# Patient Record
Sex: Male | Born: 1965 | Race: Black or African American | Hispanic: No | Marital: Married | Smoking: Never smoker
Health system: Southern US, Community
[De-identification: ages and names within clinical notes are randomized; demographics above are authoritative.]

## PROBLEM LIST (undated history)

## (undated) DIAGNOSIS — J302 Other seasonal allergic rhinitis: Secondary | ICD-10-CM

## (undated) HISTORY — PX: OTHER SURGICAL HISTORY: SHX169

---

## 2010-05-22 ENCOUNTER — Emergency Department (HOSPITAL_BASED_OUTPATIENT_CLINIC_OR_DEPARTMENT_OTHER): Admission: EM | Admit: 2010-05-22 | Discharge: 2010-05-22 | Payer: Self-pay | Admitting: Emergency Medicine

## 2010-05-22 ENCOUNTER — Ambulatory Visit: Payer: Self-pay | Admitting: Interventional Radiology

## 2011-06-27 ENCOUNTER — Ambulatory Visit (INDEPENDENT_AMBULATORY_CARE_PROVIDER_SITE_OTHER): Payer: Self-pay | Admitting: Family Medicine

## 2011-06-27 ENCOUNTER — Encounter: Payer: Self-pay | Admitting: Family Medicine

## 2011-06-27 VITALS — BP 118/75 | HR 61 | Ht 69.0 in | Wt 191.0 lb

## 2011-06-27 DIAGNOSIS — K625 Hemorrhage of anus and rectum: Secondary | ICD-10-CM

## 2011-06-27 DIAGNOSIS — Z0000 Encounter for general adult medical examination without abnormal findings: Secondary | ICD-10-CM

## 2011-06-27 DIAGNOSIS — Z23 Encounter for immunization: Secondary | ICD-10-CM

## 2011-06-27 LAB — POCT URINALYSIS DIPSTICK
Bilirubin, UA: NEGATIVE
Blood, UA: NEGATIVE
Glucose, UA: NEGATIVE
Ketones, UA: NEGATIVE
Leukocytes, UA: NEGATIVE
Nitrite, UA: NEGATIVE
Protein, UA: NEGATIVE
Spec Grav, UA: 1.02
Urobilinogen, UA: 0.2
pH, UA: 7

## 2011-06-27 NOTE — Patient Instructions (Signed)
Return in 4 months for repeat rectal exam.

## 2011-06-27 NOTE — Progress Notes (Addendum)
  Subjective:    Patient ID: Peter Riley, male    DOB: [birth year], 45 y.o.   MRN: [id number]  HPIPatient is here for HM. Hx of vocal polyops in the [employer].  Family HX of HBP and GF died of colon CA    Review of Systems  Constitutional: Negative.  Negative for activity change.  HENT: Negative.   Respiratory: Negative.   Cardiovascular: Negative.   Gastrointestinal:       Occasion rectal bleeding when straining He noticed blood afew days ago   Genitourinary: Negative.   Musculoskeletal: Negative.   Neurological: Negative.        Objective:   Physical Exam  Constitutional: He is oriented to person, place, and time. He appears well-developed and well-nourished.  HENT:  Head: Normocephalic and atraumatic.  Right Ear: External ear normal.  Left Ear: External ear normal.  Nose: Nose normal.  Mouth/Throat: Oropharynx is clear and moist.  Eyes: Conjunctivae are normal. Pupils are equal, round, and reactive to light.       fundoscopuic normal  Neck: Normal range of motion. Neck supple.  Cardiovascular: Normal rate, regular rhythm, normal heart sounds and intact distal pulses.   Pulmonary/Chest: Effort normal.  Abdominal: Soft.  Genitourinary: Prostate normal and penis normal. Guaiac positive stool.  Musculoskeletal: Normal range of motion.  Neurological: He is alert and oriented to person, place, and time. He has normal reflexes.  Skin: Skin is warm.  Psychiatric: He has a normal mood and affect.          Assessment & Plan:  Patient is to return in 2-3 months for repeat hemoccult and rectal exam.  As per our discussion if hemoccult is still positive we will ned to refer to gastroenterologist for colonoscopy.  As of 07/12/2011 he will be asked to return for reevaluation of kidney function and stop any NSAID and muscle building supplements.

## 2011-06-29 LAB — COMPLETE METABOLIC PANEL WITH GFR
AST: 35 U/L (ref 0–37)
Alkaline Phosphatase: 82 U/L (ref 39–117)
Chloride: 105 mEq/L (ref 96–112)
Creat: 1.44 mg/dL — ABNORMAL HIGH (ref 0.50–1.35)
GFR, Est African American: >60 mL/min (ref >60)
Potassium: 4.4 mEq/L (ref 3.5–5.3)
Sodium: 139 mEq/L (ref 135–145)
Total Protein: 7 g/dL (ref 6.0–8.3)

## 2011-06-29 LAB — LIPID PANEL
HDL: 31 mg/dL — ABNORMAL LOW (ref >39)
Triglycerides: 99 mg/dL (ref <150)
VLDL: 20 mg/dL (ref 0–40)

## 2011-06-29 LAB — CBC WITH DIFFERENTIAL/PLATELET
Basophils Absolute: 0 10*3/uL (ref 0.0–0.1)
Basophils Relative: 0 % (ref 0–1)
HCT: 43.8 % (ref 39.0–52.0)
Hemoglobin: 15.6 g/dL (ref 13.0–17.0)
Lymphocytes Relative: 31 % (ref 12–46)
Lymphs Abs: 1.8 10*3/uL (ref 0.7–4.0)
MCH: 27.1 pg (ref 26.0–34.0)
Monocytes Absolute: 0.5 10*3/uL (ref 0.1–1.0)
Neutro Abs: 3.3 10*3/uL (ref 1.7–7.7)
Neutrophils Relative %: 59 % (ref 43–77)
Platelets: 171 10*3/uL (ref 150–400)
WBC: 5.7 10*3/uL (ref 4.0–10.5)

## 2011-06-29 LAB — PSA: PSA: 0.63 ng/mL (ref <=4.00)

## 2011-07-03 NOTE — Patient Instructions (Addendum)
Patient is to return in 2-3 months for repeat hemoccult and rectal exam.  As per our discussion if hemoccult is still positive we will ned to refer to gastroenterologist for colonoscopy.  As of 07/12/2011 he will be asked to return for reevaluation of kidney function and stop any NSAID and muscle building supplements.

## 2011-08-06 ENCOUNTER — Inpatient Hospital Stay (INDEPENDENT_AMBULATORY_CARE_PROVIDER_SITE_OTHER)
Admission: RE | Admit: 2011-08-06 | Discharge: 2011-08-06 | Disposition: A | Discharge status: Home / Self Care | Payer: Federal, State, Local not specified - PPO | Source: Ambulatory Visit | Attending: Family Medicine | Admitting: Family Medicine

## 2011-08-06 ENCOUNTER — Encounter: Payer: Self-pay | Admitting: Family Medicine

## 2011-08-06 DIAGNOSIS — M6740 Ganglion, unspecified site: Secondary | ICD-10-CM

## 2011-09-16 NOTE — Progress Notes (Signed)
Summary: left wrist has knot on it (rm 3)   Vital Signs:  Patient Profile:   45 Years Old Male CC:      knot on left wrist x 1 month Weight:      188 pounds O2 Sat:      99 % O2 treatment:    Room Air Temp:     98.3 degrees F oral Pulse rate:   56 / minute Resp:     12 per minute BP sitting:   112 / 76  (left arm) Cuff size:   large  Vitals Entered By: Lajean Saver RN (August 06, 2011 11:29 AM)                  Updated Prior Medication List: No Medications Current Allergies: ! CODEINEHistory of Present Illness Chief Complaint: knot on left wrist x 1 month History of Present Illness:  Subjective:  Patient complains of one month history of slowly growing nodule on left wrist.  The nodule has become mildly tender and causes discomfort when using his wrist.  No known trauma.  REVIEW OF SYSTEMS Constitutional Symptoms      Denies fever, chills, night sweats, weight loss, weight gain, and fatigue.  Eyes       Denies change in vision, eye pain, eye discharge, glasses, contact lenses, and eye surgery. Ear/Nose/Throat/Mouth       Denies hearing loss/aids, change in hearing, ear pain, ear discharge, dizziness, frequent runny nose, frequent nose bleeds, sinus problems, sore throat, hoarseness, and tooth pain or bleeding.  Respiratory       Denies dry cough, productive cough, wheezing, shortness of breath, asthma, bronchitis, and emphysema/COPD.  Cardiovascular       Denies murmurs, chest pain, and tires easily with exhertion.    Gastrointestinal       Denies stomach pain, nausea/vomiting, diarrhea, constipation, blood in bowel movements, and indigestion. Genitourniary       Denies painful urination, blood or discharge from penis, kidney stones, and loss of urinary control. Neurological       Denies paralysis, seizures, and fainting/blackouts. Musculoskeletal       Denies muscle pain, joint pain, joint stiffness, decreased range of motion, redness, swelling, muscle weakness,  and gout.  Skin       Denies bruising, unusual mles/lumps or sores, and hair/skin or nail changes.  Psych       Denies mood changes, temper/anger issues, anxiety/stress, speech problems, depression, and sleep problems. Other Comments: KNot on left wrist x 1 month. Painful with movement   Past History:  Past Medical History: Unremarkable  Past Surgical History: vocal polyps removed  Social History: Married Never Smoked Alcohol use-no Drug use-no Smoking Status:  never Drug Use:  no   Objective:  Appearance:  Patient appears healthy, stated age, and in no acute distress  Left wrist:  Full range of motion.  On the volar radial aspect is a mobile subcutaneous nodule about 1cm dia.  There is mild tenderness to palpation with resisted flexion of wrist.  No erythema or warmth.  Distal neurovascular intact  Assessment New Problems: GANGLION CYST, WRIST, LEFT (ICD-727.41)   Plan New Orders: New Patient Level III [16109] Planning Comments:   Will refer to hand surgeon Dr. Melvyn Novas to consider ganglionectomy. [facility] information and instruction patient handout given   The patient and/or caregiver has been counseled thoroughly with regard to medications prescribed including dosage, schedule, interactions, rationale for use, and possible side effects and they verbalize understanding.  Diagnoses  and expected course of recovery discussed and will return if not improved as expected or if the condition worsens. Patient and/or caregiver verbalized understanding.   Orders Added: 1)  New Patient Level III [16109]

## 2011-10-29 ENCOUNTER — Ambulatory Visit: Payer: Self-pay | Admitting: Family Medicine

## 2011-10-29 DIAGNOSIS — Z0289 Encounter for other administrative examinations: Secondary | ICD-10-CM

## 2011-11-20 ENCOUNTER — Encounter (HOSPITAL_BASED_OUTPATIENT_CLINIC_OR_DEPARTMENT_OTHER): Payer: Self-pay | Admitting: Emergency Medicine

## 2011-11-20 ENCOUNTER — Emergency Department (INDEPENDENT_AMBULATORY_CARE_PROVIDER_SITE_OTHER): Payer: Federal, State, Local not specified - PPO

## 2011-11-20 ENCOUNTER — Emergency Department (HOSPITAL_BASED_OUTPATIENT_CLINIC_OR_DEPARTMENT_OTHER)
Admission: EM | Admit: 2011-11-20 | Discharge: 2011-11-20 | Disposition: A | Discharge status: Home / Self Care | Payer: Federal, State, Local not specified - PPO | Attending: Emergency Medicine | Admitting: Emergency Medicine

## 2011-11-20 DIAGNOSIS — R222 Localized swelling, mass and lump, trunk: Secondary | ICD-10-CM

## 2011-11-20 MED ORDER — IBUPROFEN 600 MG PO TABS: 600.0000 mg | ORAL_TABLET | Freq: Four times a day (QID) | ORAL | Status: AC | PRN

## 2011-11-20 NOTE — ED Provider Notes (Signed)
History     CSN: [id number]  Arrival date & time 11/20/11  2029   First MD Initiated Contact with Patient 11/20/11 2116      Chief Complaint  Patient presents with  . Breast Mass    (Consider location/radiation/quality/duration/timing/severity/associated sxs/prior treatment) HPI Comments: 46 year old male with no significant past medical history presents with a complaint of a mass in his right chest. He states that he first felt this was in the last 24 hours, it has been persistent, mild, no associated fever redness warmth or swelling. He denies any other symptoms including diarrhea, blood in the stool, cough or shortness of breath or dyspnea on exertion. He states there is no significant family history of breast cancer other than 1 aunt.  He denies tobacco use or chewing tobacco use  The history is provided by the patient and the spouse.    History reviewed. No pertinent past medical history.  Past Surgical History  Procedure Date  . Polyp vocal      No family history on file.  History  Substance Use Topics  . Smoking status: Never Smoker   . Smokeless tobacco: Not on file  . Alcohol Use: No      Review of Systems  All other systems reviewed and are negative.    Allergies  Codeine and Other  Home Medications   Current Outpatient Rx  Name Route Sig Dispense Refill  . CETIRIZINE HCL 10 MG PO TABS Oral Take 10 mg by mouth daily as needed. For allergies    . IBUPROFEN 600 MG PO TABS Oral Take 1 tablet (600 mg total) by mouth every 6 (six) hours as needed for pain. 30 tablet 0    BP 113/62  Pulse 69  Temp(Src) 98.6 F (37 C) (Oral)  Resp 18  Wt 185 lb (83.915 kg)  SpO2 100%  Physical Exam  Nursing note and vitals reviewed. Constitutional: He appears well-developed and well-nourished. No distress.  HENT:  Head: Normocephalic and atraumatic.  Mouth/Throat: Oropharynx is clear and moist. No oropharyngeal exudate.  Eyes: Conjunctivae and EOM are normal.  Pupils are equal, round, and reactive to light. Right eye exhibits no discharge. Left eye exhibits no discharge. No scleral icterus.  Neck: Normal range of motion. Neck supple. No JVD present. No thyromegaly present.  Cardiovascular: Normal rate, regular rhythm, normal heart sounds and intact distal pulses.  Exam reveals no gallop and no friction rub.   No murmur heard. Pulmonary/Chest: Effort normal and breath sounds normal. No respiratory distress. He has no wheezes. He has no rales. He exhibits tenderness ( Small mass to the right chest wall, this is palpable and mildly tender. It is not mobile and has no associated redness or change in skin texture or quality about this area. It is located in the right parasternal area, mid chest).  Abdominal: Soft. Bowel sounds are normal. He exhibits no distension and no mass. There is no tenderness.  Musculoskeletal: Normal range of motion. He exhibits no edema and no tenderness.  Lymphadenopathy:    He has no cervical adenopathy.  Neurological: He is alert. Coordination normal.  Skin: Skin is warm and dry. No rash noted. No erythema.  Psychiatric: He has a normal mood and affect. His behavior is normal.    ED Course  Procedures (including critical care time)  Labs Reviewed - No data to display Dg Chest 2 View  11/20/2011  *RADIOLOGY REPORT*  Clinical Data: Right upper chest palpable lesion.  CHEST - 2 VIEW  Comparison: None.  Findings: Lungs are clear. No pleural effusion or pneumothorax. The cardiomediastinal contours are within normal limits. The visualized bones and soft tissues are without significant appreciable abnormality.  IMPRESSION: No acute cardiopulmonary process.  Original Report Authenticated By: Waneta Martins, M.D.   Dg Ribs Unilateral Right  11/20/2011  *RADIOLOGY REPORT*  Clinical Data: Right chest mass  RIGHT RIBS - 2 VIEW  Comparison: None.  Findings: No acute or aggressive osseous lesions.  Right lung is clear.  BB marker overlies  the right paramidline mid chest.  IMPRESSION: No acute osseous abnormality.  Original Report Authenticated By: Waneta Martins, M.D.     1. Chest mass       MDM  Other than small masslike area in the right chest, there is no other findings including abnormal lung findings abdominal findings or heart findings. His exam is otherwise benign. Proceed with chest x-ray and rib x-rays to rule out mass lesions.  Chest x-ray and rib x-rays as reviewed by myself and radiologist showing no acute soft tissue or pulmonary process. Patient informed of results and will followup with family doctor.      Vida Roller, MD 11/20/11 2225

## 2011-11-20 NOTE — ED Notes (Signed)
Pt c/o feeling a lump on right sided of chest, denies pain

## 2011-11-26 ENCOUNTER — Encounter: Payer: Self-pay | Admitting: Family Medicine

## 2011-11-26 ENCOUNTER — Ambulatory Visit (INDEPENDENT_AMBULATORY_CARE_PROVIDER_SITE_OTHER): Payer: Federal, State, Local not specified - PPO | Admitting: Family Medicine

## 2011-11-26 ENCOUNTER — Ambulatory Visit: Payer: Federal, State, Local not specified - PPO | Admitting: Family Medicine

## 2011-11-26 VITALS — BP 120/73 | HR 69 | Temp 98.4°F | Ht 69.0 in | Wt 185.0 lb

## 2011-11-26 DIAGNOSIS — R6889 Other general symptoms and signs: Secondary | ICD-10-CM

## 2011-11-26 DIAGNOSIS — R899 Unspecified abnormal finding in specimens from other organs, systems and tissues: Secondary | ICD-10-CM

## 2011-11-26 DIAGNOSIS — E538 Deficiency of other specified B group vitamins: Secondary | ICD-10-CM

## 2011-11-26 DIAGNOSIS — R222 Localized swelling, mass and lump, trunk: Secondary | ICD-10-CM

## 2011-11-26 DIAGNOSIS — R195 Other fecal abnormalities: Secondary | ICD-10-CM

## 2011-11-26 NOTE — Progress Notes (Signed)
  Subjective:    Patient ID: Peter Riley, male    DOB: 10/30/1965, 46 y.o.   MRN: [id number]  HPI #1 review patient's immunizations today he does need to have a tetanus if he has not had one recently.  #2 elevated creatinine also elevated liver enzymes and bilirubin. Negative for extremely high but we'll discuss as far as what needs to be done next #3 chest lesion/mass please see the ED and x-rays were done on his chest because of a right chest mass. X-rays are negative indicating this is probably soft tissue and is actually gone down in size as longer tender like it was before. #4Positive Hemoccult card as last physical examination. #5 recent diagnosis of low B12 at the [place].   Review of Systems Essentially unremarkable    BP 120/73  Pulse 69  Temp(Src) 98.4 F (36.9 C) (Oral)  Ht 5\' 9"  (1.753 m)  Wt 185 lb (83.915 kg)  BMI 27.32 kg/m2  SpO2 97% Objective:   Physical Exam  Genitourinary: Rectal exam shows no mass, no tenderness and anal tone normal. Guaiac positive stool.   Well-nourished well-developed black male in no acute no acute distress neck supple Lungs clear cardiovascular S1 and S2.  Chest wall evaluated very small lesion palpated in the upper medial aspect of the right chest nontender to palpation and very small at this time.        Assessment & Plan:  #1 immunization review he received his tetanus at the [place] in January.  #2 elevated creatinine we'll check his creatinine to make sure there is no problem with renal failure. 7 abnormal lab was elevated bilirubin and liver enzymes but he has informed me that they had evaluated him for that and included hepatitis C and he was reassured that everything was fine. Would not do anything further as far as his bilirubin or abnormal liver enzymes unless there is a marked increase. Discussed with him in treating a low HDL a good fish oil source may help that. #3 breast mass with reduction of the breast mass negative x-rays nothing  further be done but if he starts getting larger please return and may need to consider mammography. #4 positive Hemoccult card at this time occult blood is negative but will check an H. pylori since she's had a history of ulcer disease make sure that is a factor #5 recent diagnosis of low B12. Will obtain B12 level Return in 6 months for followup.

## 2011-11-26 NOTE — Patient Instructions (Signed)
Kidney Failure Kidney failure happens when the kidneys cannot remove waste and excess fluid that naturally builds up in your blood after your body breaks down food. This leads to a dangerous buildup of waste products and fluid in the blood. HOME CARE  Follow your diet as told by your doctor.   Take all medicines as told by your doctor.   Keep all of your dialysis appointments. Call if you are unable to keep an appointment.  GET HELP RIGHT AWAY IF:   You make a lot more or very little pee (urine).   Your face or ankles puff up (swell).   You develop shortness of breath.   You develop weakness, feel tired, or you do not feel hungry (appetite loss).   You feel poorly for no known reason.  MAKE SURE YOU:   Understand these instructions.   Will watch your condition.   Will get help right away if you are not doing well or get worse.  Document Released: 12/25/2009 Document Revised: 06/12/2011 Document Reviewed: 01/31/2010 [facility] Patient Information 2012 [place], [place].Guaiac Test A guaiac test checks for blood in the poop (bowel movement). BEFORE THE TEST  Avoid alcohol and iron pills.   Only take medicine as told by your doctor.   Ask your doctor about stopping or changing medicines.   Avoid Vitamin C pills or medicines.   Avoid antiseptic solution that has iodine in it.   Avoid citrus fruits and juices.  TEST  Put the sample cards and sticks in the bathroom so they will be ready when you have to poop.   Collect a poop sample with the stick.   Smear a small amount of poop on the card.   Take another sample from a different part of the poop.   Store the cards with the samples in a plastic bag.   Throw the rest of the poop into the toilet and flush.   Wash your hands well.  AFTER THE TEST  Bring the cards in a plastic bag to your clinic.   Do not wait more than 3 days to take the card to your clinic.  Document Released: 07/09/2008 Document Revised: 06/12/2011  Document Reviewed: 07/09/2008 [facility] Patient Information 2012 [place], [place].

## 2011-11-27 LAB — CBC WITH DIFFERENTIAL/PLATELET
Eosinophils Absolute: 0.1 10*3/uL (ref 0.0–0.7)
Lymphocytes Relative: 17 % (ref 12–46)
Lymphs Abs: 1.4 10*3/uL (ref 0.7–4.0)
MCHC: 35.3 g/dL (ref 30.0–36.0)
Neutro Abs: 6 10*3/uL (ref 1.7–7.7)
Neutrophils Relative %: 74 % (ref 43–77)
Platelets: 183 10*3/uL (ref 150–400)
RDW: 13.7 % (ref 11.5–15.5)
WBC: 8.1 10*3/uL (ref 4.0–10.5)

## 2011-11-27 LAB — BASIC METABOLIC PANEL
Calcium: 9.5 mg/dL (ref 8.4–10.5)
Chloride: 105 mEq/L (ref 96–112)
Glucose, Bld: 85 mg/dL (ref 70–99)
Potassium: 4.5 mEq/L (ref 3.5–5.3)

## 2012-03-05 ENCOUNTER — Ambulatory Visit (INDEPENDENT_AMBULATORY_CARE_PROVIDER_SITE_OTHER): Payer: Federal, State, Local not specified - PPO | Admitting: Family Medicine

## 2012-03-05 ENCOUNTER — Encounter: Payer: Self-pay | Admitting: Family Medicine

## 2012-03-05 ENCOUNTER — Ambulatory Visit
Admission: RE | Admit: 2012-03-05 | Discharge: 2012-03-05 | Disposition: A | Discharge status: Home / Self Care | Payer: Federal, State, Local not specified - PPO | Source: Ambulatory Visit | Attending: Family Medicine | Admitting: Family Medicine

## 2012-03-05 VITALS — BP 101/58 | HR 80 | Ht 69.0 in | Wt 186.0 lb

## 2012-03-05 DIAGNOSIS — M79609 Pain in unspecified limb: Secondary | ICD-10-CM

## 2012-03-05 DIAGNOSIS — M79645 Pain in left finger(s): Secondary | ICD-10-CM

## 2012-03-05 NOTE — Patient Instructions (Signed)
We will call you with the x-ray report 

## 2012-03-05 NOTE — Progress Notes (Signed)
  Subjective:    Patient ID: Peter Riley, male    DOB: 01/03/1966, 46 y.o.   MRN: [id number]  HPI Injured finger about a monht ago playing basketball. Finger was jammed. It did swell and had hard time flexing it initially.   Pain is 6/10.  Pain is daily and constant.  Still in the AM. He says he can't wear his ring on that finger. It's worse with activity.Not using any pain relievers.    Review of Systems     Objective:   Physical Exam  Musculoskeletal:       Left hand, ring finger with normal range of motion. Strength is normal. He is tender over the PIP posteriorly and laterally. Some trace swelling.          Assessment & Plan:  Left hand ring finger- will get x-rays to rule out fracture or foreign body.If neg then consider immobilization for 1-2 weeks to rest the joint and then advace activity slowly. Can use NSAIDs as needed.

## 2012-05-26 ENCOUNTER — Ambulatory Visit: Payer: Federal, State, Local not specified - PPO | Admitting: Family Medicine

## 2012-06-02 ENCOUNTER — Encounter: Payer: Self-pay | Admitting: Family Medicine

## 2012-06-02 ENCOUNTER — Ambulatory Visit (INDEPENDENT_AMBULATORY_CARE_PROVIDER_SITE_OTHER): Payer: Federal, State, Local not specified - PPO | Admitting: Family Medicine

## 2012-06-02 VITALS — BP 108/72 | HR 53 | Ht 69.0 in | Wt 190.0 lb

## 2012-06-02 DIAGNOSIS — R6889 Other general symptoms and signs: Secondary | ICD-10-CM

## 2012-06-02 DIAGNOSIS — L989 Disorder of the skin and subcutaneous tissue, unspecified: Secondary | ICD-10-CM

## 2012-06-02 DIAGNOSIS — R899 Unspecified abnormal finding in specimens from other organs, systems and tissues: Secondary | ICD-10-CM

## 2012-06-02 DIAGNOSIS — D179 Benign lipomatous neoplasm, unspecified: Secondary | ICD-10-CM

## 2012-06-02 DIAGNOSIS — S62609A Fracture of unspecified phalanx of unspecified finger, initial encounter for closed fracture: Secondary | ICD-10-CM

## 2012-06-02 LAB — BASIC METABOLIC PANEL
BUN: 18 mg/dL (ref 6–23)
Chloride: 106 mEq/L (ref 96–112)
Creat: 1.27 mg/dL (ref 0.50–1.35)
Glucose, Bld: 92 mg/dL (ref 70–99)
Potassium: 4.7 mEq/L (ref 3.5–5.3)

## 2012-06-02 LAB — HEPATIC FUNCTION PANEL
ALT: 36 U/L (ref 0–53)
AST: 22 U/L (ref 0–37)
Alkaline Phosphatase: 95 U/L (ref 39–117)
Total Bilirubin: 1.2 mg/dL (ref 0.3–1.2)
Total Protein: 7 g/dL (ref 6.0–8.3)

## 2012-06-02 LAB — HEPATITIS C ANTIBODY: HCV Ab: NEGATIVE

## 2012-06-02 NOTE — Progress Notes (Signed)
Subjective:    Patient ID: Peter Riley, male    DOB: 03-19-1966, 46 y.o.   MRN: [id number]  HPI #1 patient's here for followup of abnormal kidney functions. He's also had history of abnormal liver enzymes as well but that has been ongoing. The last minute the looked normal bowel recommend that the and liver enzymes just to make sure it is no new changes.  #2 chest mass. Rib films and chest x-ray shows no bony or tissue masses so this mass on his right chest appears to be subcutaneous. He reports some discomfort when his stretching or picking up things but not on regular basis. At this point time I would probably recommend a repeat chest x-ray sometimes after February once a year. #3 left fourth finger injury he reports still having some pain on the left fourth finger. He was told x-rays looked okay. #4 mass on his right lower leg he reports noticing a lump on his right lower leg he was concerned about. #5 history of Hemoccult positive stool. Should be noted that H. pylori was done but it was a IgG which is tells [place] his been exposed to the bacteria. He is to have any symptoms at this time.     Review of Systems  Musculoskeletal: Positive for joint swelling and arthralgias.  Skin:       Right leg lesion  All other systems reviewed and are negative.      BP 108/72  Pulse 53  Ht 5\' 9"  (1.753 m)  Wt 190 lb (86.183 kg)  BMI 28.06 kg/m2  SpO2 98% Objective:   Physical Exam  Constitutional: He is oriented to person, place, and time. He appears well-developed and well-nourished. No distress.  HENT:  Head: Normocephalic.  Musculoskeletal: He exhibits no edema.       Left fourth finger is tenderness along the medial border  There is a lipoma-like lesion on the right lower right leg.  Neurological: He is alert and oriented to person, place, and time.  Skin: Skin is warm and dry. He is not diaphoretic. No erythema.  Psychiatric: He has a normal mood and affect. His behavior is normal.           Results for orders placed in visit on 11/26/11  BASIC METABOLIC PANEL      Component Value Range   Sodium 141  135 - 145 mEq/L   Potassium 4.5  3.5 - 5.3 mEq/L   Chloride 105  96 - 112 mEq/L   CO2 26  19 - 32 mEq/L   Glucose, Bld 85  70 - 99 mg/dL   BUN 17  6 - 23 mg/dL   Creat 4.09  8.11 - 9.14 mg/dL   Calcium 9.5  8.4 - 78.2 mg/dL  H. PYLORI ANTIBODY, IGG      Component Value Range   H Pylori IgG 0.51    CBC WITH DIFFERENTIAL      Component Value Range   WBC 8.1  4.0 - 10.5 K/uL   RBC 5.68  4.22 - 5.81 MIL/uL   Hemoglobin 15.2  13.0 - 17.0 g/dL   HCT 95.6  21.3 - 08.6 %   MCV 75.9 (*) 78.0 - 100.0 fL   MCH 26.8  26.0 - 34.0 pg   MCHC 35.3  30.0 - 36.0 g/dL   RDW 57.8  46.9 - 62.9 %   Platelets 183  150 - 400 K/uL   Neutrophils Relative 74  43 - 77 %   Neutro  Abs 6.0  1.7 - 7.7 K/uL   Lymphocytes Relative 17  12 - 46 %   Lymphs Abs 1.4  0.7 - 4.0 K/uL   Monocytes Relative 8  3 - 12 %   Monocytes Absolute 0.7  0.1 - 1.0 K/uL   Eosinophils Relative 1  0 - 5 %   Eosinophils Absolute 0.1  0.0 - 0.7 K/uL   Basophils Relative 0  0 - 1 %   Basophils Absolute 0.0  0.0 - 0.1 K/uL   Smear Review Criteria for review not met    VITAMIN B12      Component Value Range   Vitamin B-12 509  211 - 911 pg/mL   Assessment & Plan:  #1 Will recheck BMP and liver enzymes today. #2 repeat chest x-ray probably once a year we'll probably do that when he comes back in 6 months for his yearly examination. #3 in reviewing his x-rays of his left fourth finger it turns out that he had a buckle fracture of the fourth finger that was healing. At the time it was felt no further intervention was needed however he still having trouble wearing his his wedding ring on that finger. I will refer him to Dr. Karie Schwalbe. to see if anything further needs or should be done. 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 Reassure nothing further needs to be done. #5 probably need to recheck H. pylori just to  make sure no signs of ulcers are present. Return in early March for yearly examination.

## 2012-06-02 NOTE — Patient Instructions (Signed)
Lipoma A lipoma is a noncancerous (benign) tumor composed of fat cells. They are usually found under the skin (subcutaneous). A lipoma may occur in any tissue of the body that contains fat. Common areas for lipomas to appear include the back, shoulders, buttocks, and thighs. Lipomas are a very common soft tissue growth. They are soft and grow slowly. Most problems caused by a lipoma depend on where it is growing. DIAGNOSIS  A lipoma can be diagnosed with a physical exam. These tumors rarely become cancerous, but radiographic studies can help determine this for certain. Studies used may include:  Computerized X-ray scans (CT or CAT scan).   Computerized magnetic scans (MRI).  TREATMENT  Small lipomas that are not causing problems may be watched. If a lipoma continues to enlarge or causes problems, removal is often the best treatment. Lipomas can also be removed to improve appearance. Surgery is done to remove the fatty cells and the surrounding capsule. Most often, this is done with medicine that numbs the area (local anesthetic). The removed tissue is examined under a microscope to make sure it is not cancerous. Keep all follow-up appointments with your caregiver. SEEK MEDICAL CARE IF:   The lipoma becomes larger or hard.   The lipoma becomes painful, red, or increasingly swollen. These could be signs of infection or a more serious condition.  Document Released: 09/20/2002 Document Revised: 09/19/2011 Document Reviewed: 03/02/2010 [employer] Patient Information 2012 [employer]. 

## 2012-06-03 LAB — HEPATITIS B CORE ANTIBODY, TOTAL: Hep B Core Total Ab: NEGATIVE

## 2012-06-10 ENCOUNTER — Encounter: Payer: Self-pay | Admitting: Sports Medicine

## 2012-06-10 ENCOUNTER — Ambulatory Visit (INDEPENDENT_AMBULATORY_CARE_PROVIDER_SITE_OTHER): Payer: Federal, State, Local not specified - PPO | Admitting: Sports Medicine

## 2012-06-10 ENCOUNTER — Ambulatory Visit (INDEPENDENT_AMBULATORY_CARE_PROVIDER_SITE_OTHER): Payer: Federal, State, Local not specified - PPO

## 2012-06-10 VITALS — BP 110/65 | HR 60 | Wt 193.0 lb

## 2012-06-10 DIAGNOSIS — M25549 Pain in joints of unspecified hand: Secondary | ICD-10-CM

## 2012-06-10 DIAGNOSIS — M79645 Pain in left finger(s): Secondary | ICD-10-CM | POA: Insufficient documentation

## 2012-06-10 DIAGNOSIS — Z09 Encounter for follow-up examination after completed treatment for conditions other than malignant neoplasm: Secondary | ICD-10-CM

## 2012-06-10 DIAGNOSIS — M79609 Pain in unspecified limb: Secondary | ICD-10-CM

## 2012-06-10 MED ORDER — MELOXICAM 15 MG PO TABS: ORAL_TABLET | ORAL | Status: AC

## 2012-06-10 NOTE — Assessment & Plan Note (Signed)
Injection of the joint as above. Mobic. Home rehabilitation. Return to clinic in 3 weeks.

## 2012-06-10 NOTE — Progress Notes (Addendum)
Patient ID: Peter Riley, male   DOB: 10-16-1965, 46 y.o.   MRN: [id number] Subjective:    I'm seeing this patient as a consultation for:  Dr. Thurmond Butts  CC: Left ring finger pain.  HPI: This is a very pleasant 46 year old male who comes in with 3-4 months of pain he localizes in the dorsomedial aspect of the proximal interphalangeal joint of his left ring finger. This happened after an injury. X-rays were done at that time that showed a possible buckle fracture of the proximal cortex of the middle phalanx. The pain has been persistent, is localized, it does not radiate. He has not tried any oral analgesics. He has not been evaluated by another physician other than his primary care physician.  He is to use the hand, and it does not limit him at work.  Past medical history, Surgical history, Family history, Social history, Allergies, and medications have been entered into the medical record, reviewed, and no changes needed.   Review of Systems: No headache, visual changes, nausea, vomiting, diarrhea, constipation, dizziness, abdominal pain, skin rash, fevers, chills, night sweats, weight loss, body aches, joint swelling, muscle aches, chest pain, or shortness of breath.   Objective:   Vitals:  Afebrile, vital signs stable. General: Well Developed, well nourished, and in no acute distress.  Neuro: Alert and oriented x3, extra-ocular muscles intact.  Skin: Warm and dry, no rashes noted.  Respiratory: Not using accessory muscles, speaking in full sentences.  Cardiovascular: Pulses palpable, no extremity edema. There's tender to palpation at the medial aspect of the proximal interphalangeal joint.  I did obtain another set of x-rays, and review them. They show no degenerative change at the proximal interphalangeal joint. There is no longer any cortical irregularity. They're essentially unchanged from prior.  Procedure: Limited ultrasound of left proximal interphalangeal joint of the fourth  finger. Device: GE logiq E. Findings: There is some irregularity over the dorsal aspect of the base of the middle phalanx. This is consistent with an old fracture.  Impression: Old fracture at the base of the middle phalanx.  Images permanently stored in the unit and are available for review.  Procedure:  Injection of left fourth proximal interphalangeal joint injection. Consent obtained and verified. Time-out conducted. Noted no overlying erythema, induration, or other signs of local infection. Skin prepped in a sterile fashion. Topical analgesic spray: Ethyl chloride. Completed without difficulty. Meds: 1/2 cc Kenalog 40, 1/2 cc of lidocaine 1%. Pain immediately improved suggesting accurate placement of the medication. Advised to call if fevers/chills, erythema, induration, drainage, or persistent bleeding.   Impression and Recommendations:

## 2012-07-01 ENCOUNTER — Ambulatory Visit: Payer: Federal, State, Local not specified - PPO | Admitting: Sports Medicine

## 2012-12-31 ENCOUNTER — Encounter: Payer: Federal, State, Local not specified - PPO | Admitting: Family Medicine

## 2013-02-01 ENCOUNTER — Ambulatory Visit (INDEPENDENT_AMBULATORY_CARE_PROVIDER_SITE_OTHER): Payer: Federal, State, Local not specified - PPO | Admitting: Sports Medicine

## 2013-02-01 VITALS — BP 107/72 | HR 54

## 2013-02-01 DIAGNOSIS — M79645 Pain in left finger(s): Secondary | ICD-10-CM

## 2013-02-01 DIAGNOSIS — M25549 Pain in joints of unspecified hand: Secondary | ICD-10-CM

## 2013-02-01 NOTE — Progress Notes (Signed)
   Subjective:    CC: Finger  HPI: I saw this pleasant 47 year old male approximately 9 months ago, he had had a fracture of his finger, near the joint. The fracture healed although he had some persistent pain a localized at the left fourth proximal interphalangeal joint. I injected the joint under guidance, and he had precisely 9 months of no pain. Unfortunately he's noted an increase in his pain over the last couple of weeks. Pain is localized, doesn't radiate, mild to moderate.  Past medical history, Surgical history, Family history not pertinant except as noted below, Social history, Allergies, and medications have been entered into the medical record, reviewed, and no changes needed.   Review of Systems: No headache, visual changes, nausea, vomiting, diarrhea, constipation, dizziness, abdominal pain, skin rash, fevers, chills, night sweats, weight loss, swollen lymph nodes, body aches, joint swelling, muscle aches, chest pain, shortness of breath, mood changes, visual or auditory hallucinations.   Objective:   General: Well Developed, well nourished, and in no acute distress.  Neuro/Psych: Alert and oriented x3, extra-ocular muscles intact, able to move all 4 extremities, sensation grossly intact. Skin: Warm and dry, no rashes noted.  Respiratory: Not using accessory muscles, speaking in full sentences, trachea midline.  Cardiovascular: Pulses palpable, no extremity edema. Abdomen: Does not appear distended. Left 4th finger:  There's tender to palpation at the medial aspect of the proximal interphalangeal joint.  Procedure: Real-time Ultrasound Guided Injection of left fourth proximal interphalangeal joint. Device: GE Logiq E  Ultrasound guided injection is preferred based studies that show increased duration, increased effect, greater accuracy, decreased procedural pain, increased response rate, and decreased cost with ultrasound guided versus blind injection.  Verbal informed consent  obtained.  Time-out conducted.  Noted no overlying erythema, induration, or other signs of local infection.  Skin prepped in a sterile fashion.  Local anesthesia: Topical Ethyl chloride.  With sterile technique and under real time ultrasound guidance:  1/2 cc Kenalog 40, 1/2 cc lidocaine injected easily into the joint. Capsule was seen distending. Completed without difficulty  Pain immediately resolved suggesting accurate placement of the medication.  Advised to call if fevers/chills, erythema, induration, drainage, or persistent bleeding.  Images permanently stored and available for review in the ultrasound unit.  Impression: Technically successful ultrasound guided injection.  Impression and Recommendations:   This case required medical decision making of moderate complexity.

## 2013-02-01 NOTE — Assessment & Plan Note (Signed)
9 months of pain relief from the last injection. Repeated today. Return in one month, and then as needed.

## 2013-03-03 ENCOUNTER — Ambulatory Visit: Payer: Federal, State, Local not specified - PPO | Admitting: Sports Medicine

## 2013-03-03 DIAGNOSIS — Z0289 Encounter for other administrative examinations: Secondary | ICD-10-CM

## 2013-05-06 IMAGING — CR DG CHEST 2V
2 series · 2 of 2 positions shown · non-contrast
Comparison: None.

CLINICAL DATA: Right upper chest palpable lesion.

CHEST - 2 VIEW

[w chest pa]
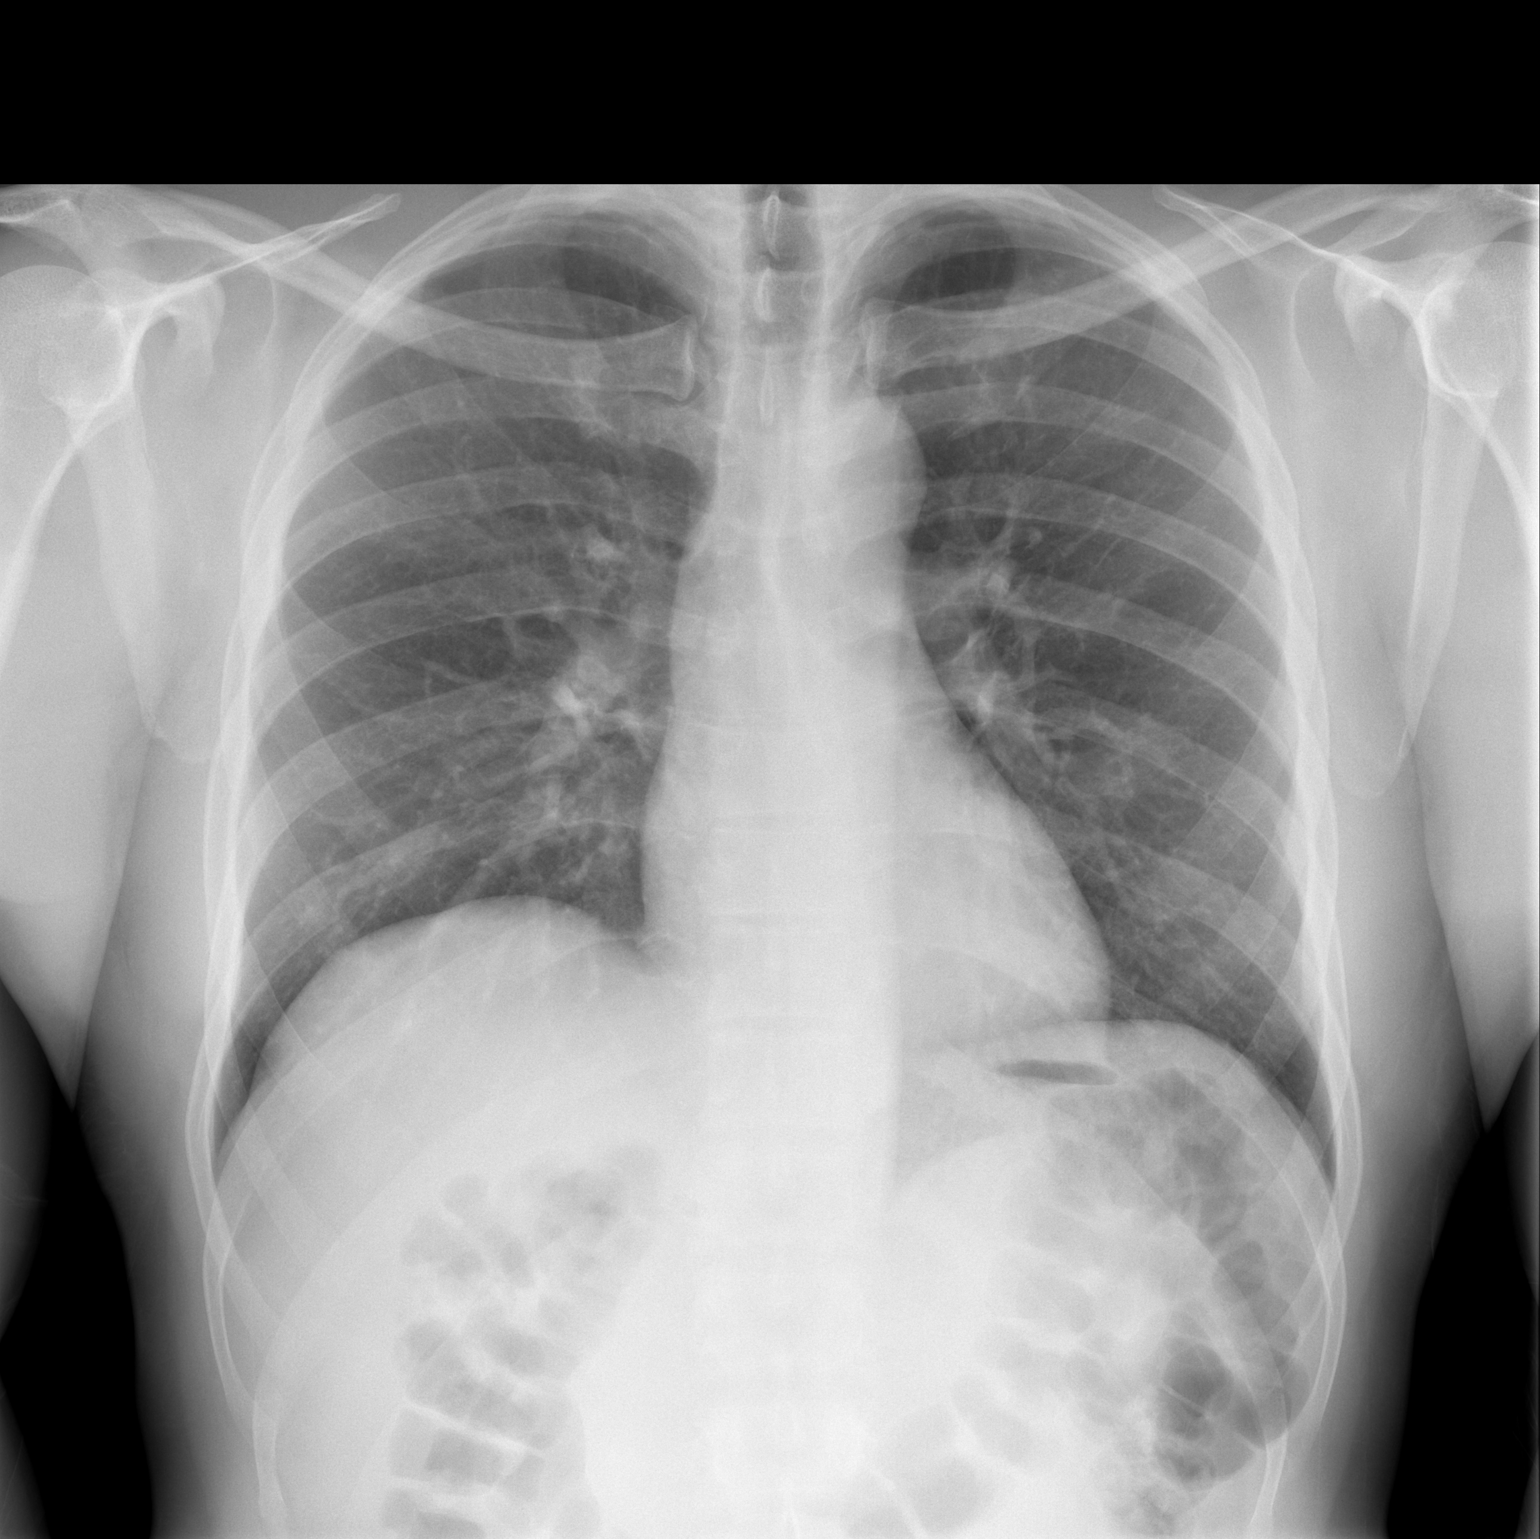

[w chest lat]
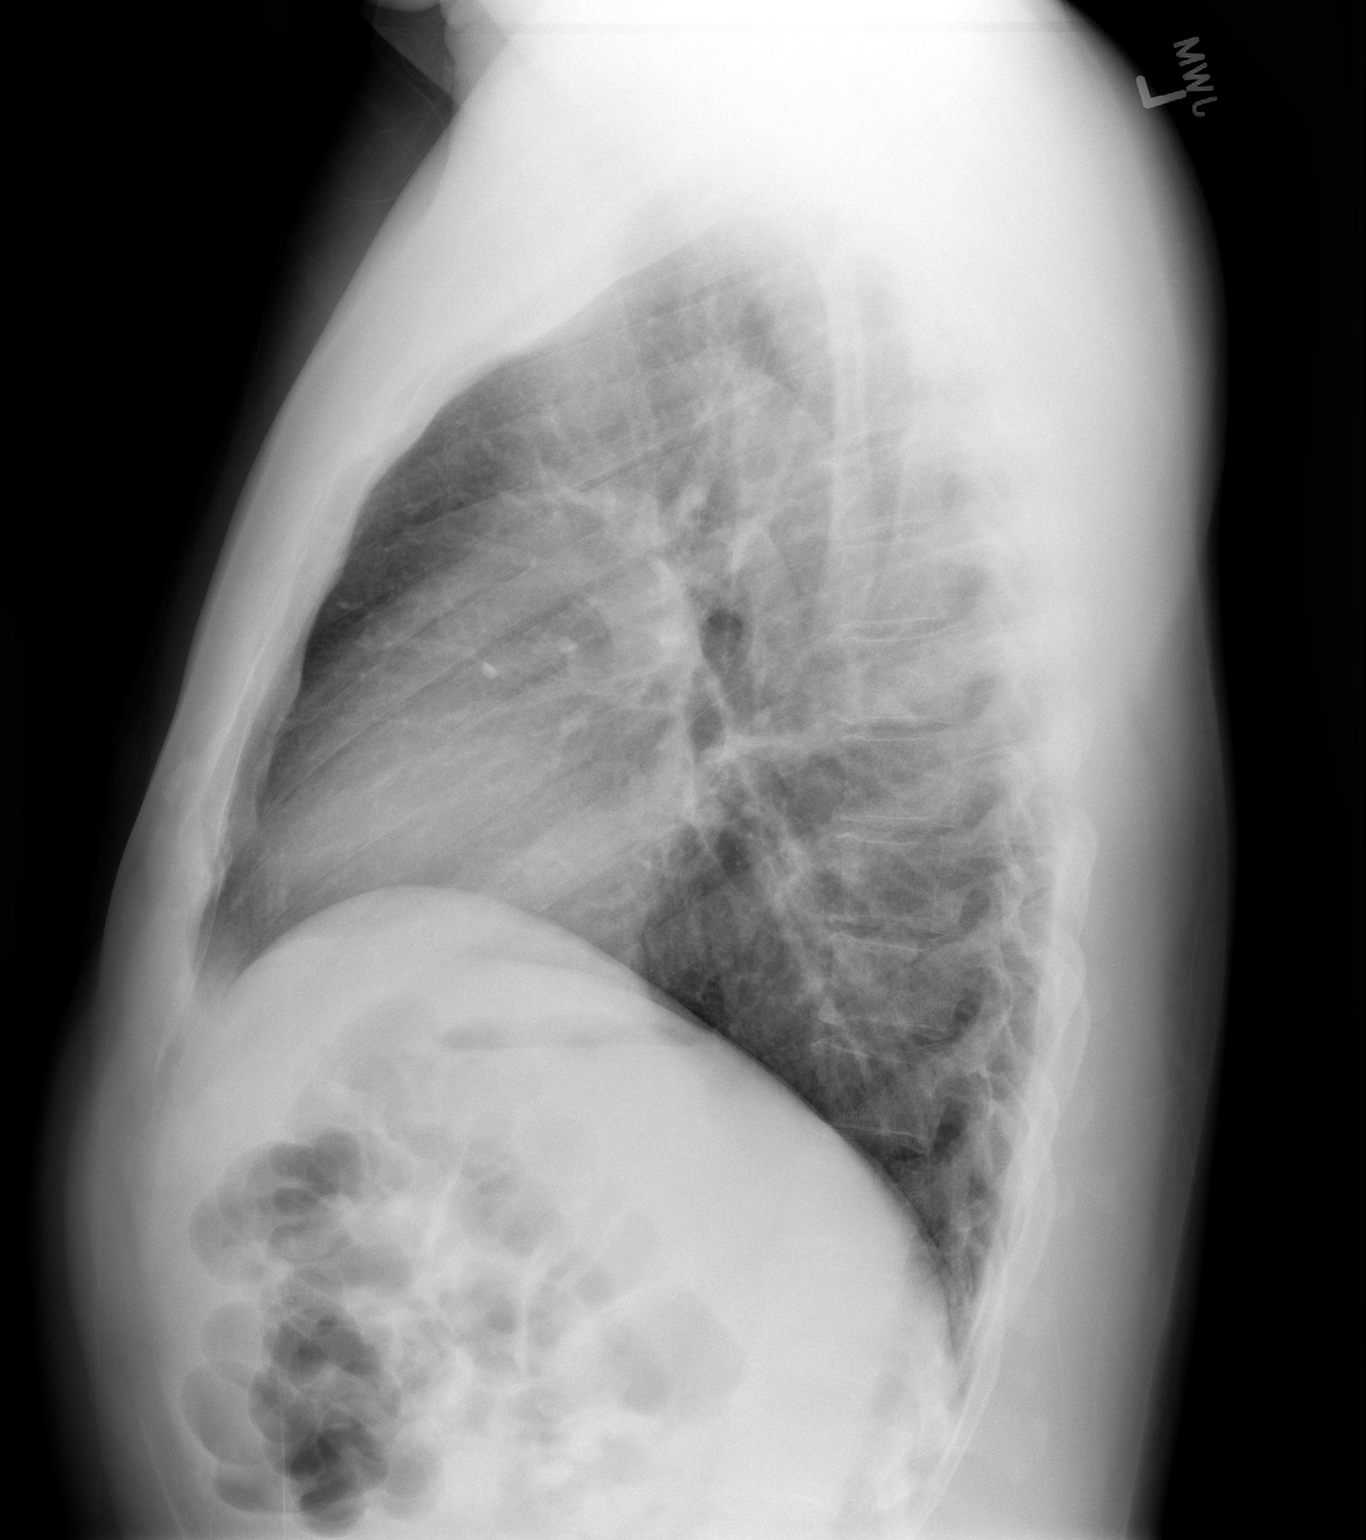

[2 of 2 positions shown; findings below may reference images not displayed]

FINDINGS: Lungs are clear. No pleural effusion or pneumothorax. The
cardiomediastinal contours are within normal limits. The visualized
bones and soft tissues are without significant appreciable
abnormality.
IMPRESSION: No acute cardiopulmonary process.

## 2013-11-25 ENCOUNTER — Other Ambulatory Visit: Payer: Self-pay | Admitting: Internal Medicine

## 2013-11-25 DIAGNOSIS — R7402 Elevation of levels of lactic acid dehydrogenase (LDH): Secondary | ICD-10-CM

## 2013-11-25 DIAGNOSIS — R7401 Elevation of levels of liver transaminase levels: Secondary | ICD-10-CM

## 2013-11-25 DIAGNOSIS — R740 Nonspecific elevation of levels of transaminase and lactic acid dehydrogenase [LDH]: Principal | ICD-10-CM

## 2013-12-02 ENCOUNTER — Ambulatory Visit
Admission: RE | Admit: 2013-12-02 | Discharge: 2013-12-02 | Disposition: A | Discharge status: Home / Self Care | Payer: Federal, State, Local not specified - PPO | Source: Ambulatory Visit | Attending: Internal Medicine | Admitting: Internal Medicine

## 2013-12-02 DIAGNOSIS — R7401 Elevation of levels of liver transaminase levels: Secondary | ICD-10-CM

## 2013-12-02 DIAGNOSIS — R740 Nonspecific elevation of levels of transaminase and lactic acid dehydrogenase [LDH]: Principal | ICD-10-CM

## 2015-05-22 ENCOUNTER — Encounter (HOSPITAL_BASED_OUTPATIENT_CLINIC_OR_DEPARTMENT_OTHER): Payer: Self-pay | Admitting: *Deleted

## 2015-05-22 ENCOUNTER — Emergency Department (HOSPITAL_BASED_OUTPATIENT_CLINIC_OR_DEPARTMENT_OTHER)
Admission: EM | Admit: 2015-05-22 | Discharge: 2015-05-22 | Disposition: A | Discharge status: Home / Self Care | Attending: Emergency Medicine | Admitting: Emergency Medicine

## 2015-05-22 ENCOUNTER — Emergency Department (HOSPITAL_BASED_OUTPATIENT_CLINIC_OR_DEPARTMENT_OTHER)

## 2015-05-22 DIAGNOSIS — S6990XA Unspecified injury of unspecified wrist, hand and finger(s), initial encounter: Secondary | ICD-10-CM | POA: Diagnosis present

## 2015-05-22 DIAGNOSIS — S60415A Abrasion of left ring finger, initial encounter: Secondary | ICD-10-CM | POA: Insufficient documentation

## 2015-05-22 DIAGNOSIS — Y998 Other external cause status: Secondary | ICD-10-CM | POA: Diagnosis not present

## 2015-05-22 DIAGNOSIS — Y9289 Other specified places as the place of occurrence of the external cause: Secondary | ICD-10-CM | POA: Diagnosis not present

## 2015-05-22 DIAGNOSIS — S6710XA Crushing injury of unspecified finger(s), initial encounter: Secondary | ICD-10-CM

## 2015-05-22 DIAGNOSIS — Z23 Encounter for immunization: Secondary | ICD-10-CM | POA: Diagnosis not present

## 2015-05-22 DIAGNOSIS — Y9389 Activity, other specified: Secondary | ICD-10-CM | POA: Diagnosis not present

## 2015-05-22 DIAGNOSIS — S67193A Crushing injury of left middle finger, initial encounter: Secondary | ICD-10-CM | POA: Insufficient documentation

## 2015-05-22 DIAGNOSIS — W228XXA Striking against or struck by other objects, initial encounter: Secondary | ICD-10-CM | POA: Diagnosis not present

## 2015-05-22 MED ORDER — TETANUS-DIPHTH-ACELL PERTUSSIS 5-2.5-18.5 LF-MCG/0.5 IM SUSP
0.5000 mL | Freq: Once | INTRAMUSCULAR | Status: AC
  Administered 2015-05-22: 0.5 mL via INTRAMUSCULAR
  Filled 2015-05-22: qty 0.5

## 2015-05-22 NOTE — Discharge Instructions (Signed)
Crush Injury, Fingers or Toes °A crush injury to the fingers or toes means the tissues have been damaged by being squeezed (compressed). There will be bleeding into the tissues and swelling. Often, blood will collect under the skin. When this happens, the skin on the finger often dies and may slough off (shed) 1 week to 10 days later. Usually, new skin is growing underneath. If the injury has been too severe and the tissue does not survive, the damaged tissue may begin to turn black over several days.  °Wounds which occur because of the crushing may be stitched (sutured) shut. However, crush injuries are more likely to become infected than other injuries. These wounds may not be closed as tightly as other types of cuts to prevent infection. Nails involved are often lost. These usually grow back over several weeks.  °DIAGNOSIS °X-rays may be taken to see if there is any injury to the bones. °TREATMENT °Broken bones (fractures) may be treated with splinting, depending on the fracture. Often, no treatment is required for fractures of the last bone in the fingers or toes. °HOME CARE INSTRUCTIONS  °· The crushed part should be raised (elevated) above the heart or center of the chest as much as possible for the first several days or as directed. This helps with pain and lessens swelling. Less swelling increases the chances that the crushed part will survive. °· Put ice on the injured area. °¨ Put ice in a plastic bag. °¨ Place a towel between your skin and the bag. °¨ Leave the ice on for 15-20 minutes, 03-04 times a day for the first 2 days. °· Only take over-the-counter or prescription medicines for pain, discomfort, or fever as directed by your caregiver. °· Use your injured part only as directed. °· Change your bandages (dressings) as directed. °· Keep all follow-up appointments as directed by your caregiver. Not keeping your appointment could result in a chronic or permanent injury, pain, and disability. If there is  any problem keeping the appointment, you must call to reschedule. °SEEK IMMEDIATE MEDICAL CARE IF:  °· There is redness, swelling, or increasing pain in the wound area. °· Pus is coming from the wound. °· You have a fever. °· You notice a bad smell coming from the wound or dressing. °· The edges of the wound do not stay together after the sutures have been removed. °· You are unable to move the injured finger or toe. °MAKE SURE YOU:  °· Understand these instructions. °· Will watch your condition. °· Will get help right away if you are not doing well or get worse. °Document Released: 09/30/2005 Document Revised: 12/23/2011 Document Reviewed: 02/15/2011 °Exit[employer]® Patient Information ©2015 [employer]. This information is not intended to replace advice given to you by your health care provider. Make sure you discuss any questions you have with your health care provider. ° °

## 2015-05-22 NOTE — ED Provider Notes (Signed)
CSN: [id number]     Arrival date & time 05/22/15  0750 History   First MD Initiated Contact with Patient 05/22/15 0800     No chief complaint on file.    (Consider location/radiation/quality/duration/timing/severity/associated sxs/prior Treatment) Patient is a 49 y.o. male presenting with hand pain.  Hand Pain This is a new problem. The current episode started 1 to 2 hours ago. The problem occurs constantly. The problem has not changed since onset.Pertinent negatives include no chest pain, no abdominal pain, no headaches and no shortness of breath. Exacerbated by: moving, flexing. Nothing relieves the symptoms. He has tried nothing for the symptoms.    History reviewed. No pertinent past medical history. Past Surgical History  Procedure Laterality Date  . Polyp vocal      No family history on file. History  Substance Use Topics  . Smoking status: Never Smoker   . Smokeless tobacco: Not on file  . Alcohol Use: No    Review of Systems  Respiratory: Negative for shortness of breath.   Cardiovascular: Negative for chest pain.  Gastrointestinal: Negative for abdominal pain.  Neurological: Negative for headaches.  All other systems reviewed and are negative.     Allergies  Codeine and Other  Home Medications   Prior to Admission medications   Medication Sig Start Date End Date Taking? Authorizing Provider  cetirizine (ZYRTEC) 10 MG tablet Take 10 mg by mouth daily as needed. For allergies   Yes Historical Provider, MD   BP 119/90 mmHg  Pulse 68  Temp(Src) 98.1 F (36.7 C) (Oral)  Resp 16  Ht 5\' 8"  (1.727 m)  Wt 182 lb (82.555 kg)  BMI 27.68 kg/m2  SpO2 100% Physical Exam  Constitutional: He is oriented to person, place, and time. He appears well-developed and well-nourished.  HENT:  Head: Normocephalic and atraumatic.  Eyes: Conjunctivae and EOM are normal.  Neck: Normal range of motion. Neck supple.  Cardiovascular: Normal rate, regular rhythm and normal heart  sounds.   Pulmonary/Chest: Effort normal and breath sounds normal. No respiratory distress.  Abdominal: He exhibits no distension. There is no tenderness. There is no rebound and no guarding.  Musculoskeletal: Normal range of motion.  Small superficial laceration and contusion to L 3rd finger DIP and middle phalanx  Neurological: He is alert and oriented to person, place, and time.  Skin: Skin is warm and dry.  Vitals reviewed.   ED Course  Procedures (including critical care time) Labs Review Labs Reviewed - No data to display  Imaging Review Dg Finger Middle Left  05/22/2015   CLINICAL DATA:  Crush injury of the right middle finger at work  EXAM: LEFT MIDDLE FINGER 2+V  COMPARISON:  None  FINDINGS: The bones of the third or long finger are adequately mineralized. There is no acute fracture. There is a tiny bony density adjacent to the radial aspect of the head of the proximal phalanx. No donor site is observed. There is mild diffuse soft tissue swelling of the digit slightly greater at the PIP joint region.  IMPRESSION: No definite acute fracture is observed, and there is no dislocation. Tiny bony density adjacent to the head of the proximal phalanx and may ref reflect an avulsion in the appropriate setting but a donor site is not clearly evident.   Electronically Signed   By: David  Martinique M.D.   On: 05/22/2015 08:35     EKG Interpretation None      MDM   Final diagnoses:  Crush injury to  finger, initial encounter    49 y.o. male without pertinent PMH presents with crush injury to L 3rd finger from pallet.  Not wooden, no foreign bodies.  NV intact with injuries as above.  Wu unremarkable.  FROM.  DC home in stable condition  I have reviewed all laboratory and imaging studies if ordered as above  1. Crush injury to finger, initial encounter         Debby Freiberg, MD 05/22/15 [phone]

## 2015-05-22 NOTE — ED Notes (Signed)
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 Puncture wound to middle finger.

## 2015-05-22 NOTE — ED Notes (Signed)
Ice pack given

## 2015-05-22 NOTE — ED Notes (Signed)
Patient transported to & from radiology.

## 2015-12-14 ENCOUNTER — Emergency Department (HOSPITAL_BASED_OUTPATIENT_CLINIC_OR_DEPARTMENT_OTHER): Payer: Federal, State, Local not specified - PPO

## 2015-12-14 ENCOUNTER — Encounter (HOSPITAL_BASED_OUTPATIENT_CLINIC_OR_DEPARTMENT_OTHER): Payer: Self-pay | Admitting: *Deleted

## 2015-12-14 ENCOUNTER — Emergency Department (HOSPITAL_BASED_OUTPATIENT_CLINIC_OR_DEPARTMENT_OTHER)
Admission: EM | Admit: 2015-12-14 | Discharge: 2015-12-14 | Disposition: A | Discharge status: Home / Self Care | Payer: Federal, State, Local not specified - PPO | Attending: Emergency Medicine | Admitting: Emergency Medicine

## 2015-12-14 DIAGNOSIS — J988 Other specified respiratory disorders: Secondary | ICD-10-CM

## 2015-12-14 DIAGNOSIS — J069 Acute upper respiratory infection, unspecified: Secondary | ICD-10-CM | POA: Diagnosis not present

## 2015-12-14 DIAGNOSIS — B9789 Other viral agents as the cause of diseases classified elsewhere: Secondary | ICD-10-CM

## 2015-12-14 DIAGNOSIS — R059 Cough, unspecified: Secondary | ICD-10-CM

## 2015-12-14 DIAGNOSIS — R05 Cough: Secondary | ICD-10-CM

## 2015-12-14 MED ORDER — IBUPROFEN 400 MG PO TABS
600.0000 mg | ORAL_TABLET | Freq: Once | ORAL | Status: AC
  Administered 2015-12-14: 600 mg via ORAL
  Filled 2015-12-14: qty 1

## 2015-12-14 MED ORDER — GUAIFENESIN 100 MG/5ML PO LIQD: 100.0000 mg | ORAL | Status: DC | PRN

## 2015-12-14 NOTE — ED Provider Notes (Signed)
CSN: [id number]     Arrival date & time 12/14/15  1147 History   First MD Initiated Contact with Patient 12/14/15 1207     Chief Complaint  Patient presents with  . Cough     (Consider location/radiation/quality/duration/timing/severity/associated sxs/prior Treatment) HPI 50 year old male who presents with fever and cough, starting last night. Otherwise healthy. Unknown sick contacts. States associated with mild headache, congestion, and sore throat.   History reviewed. No pertinent past medical history. Past Surgical History  Procedure Laterality Date  . Polyp vocal      History reviewed. No pertinent family history. Social History  Substance Use Topics  . Smoking status: Never Smoker   . Smokeless tobacco: Never Used  . Alcohol Use: No    Review of Systems  Constitutional: Positive for fever.  HENT: Positive for congestion.   Respiratory: Positive for cough.   Cardiovascular: Negative for leg swelling.  Gastrointestinal: Negative for nausea and vomiting.  Neurological: Negative for syncope.  Psychiatric/Behavioral: Negative for confusion.  All other systems reviewed and are negative.     Allergies  Codeine and Other  Home Medications   Prior to Admission medications   Medication Sig Start Date End Date Taking? Authorizing Provider  cetirizine (ZYRTEC) 10 MG tablet Take 10 mg by mouth daily as needed. For allergies   Yes Historical Provider, MD  guaiFENesin (ROBITUSSIN) 100 MG/5ML liquid Take 5-10 mLs (100-200 mg total) by mouth every 4 (four) hours as needed for cough. 12/14/15   Forde Dandy, MD   BP 119/71 mmHg  Pulse 88  Temp(Src) 100.5 F (38.1 C) (Oral)  Resp 18  Ht 5\' 9"  (1.753 m)  Wt 183 lb (83.008 kg)  BMI 27.01 kg/m2 Physical Exam Physical Exam  Nursing note and vitals reviewed. Constitutional: Well developed, well nourished, non-toxic, and in no acute distress Head: Normocephalic and atraumatic.  Mouth/Throat: Oropharynx is clear and moist.   Neck: Normal range of motion. Neck supple.  Cardiovascular: Normal rate and regular rhythm.   Pulmonary/Chest: Effort normal and breath sounds normal.  Abdominal: Soft. There is no tenderness. There is no rebound and no guarding.  Musculoskeletal: Normal range of motion.  Neurological: Alert, no facial droop, fluent speech, moves all extremities symmetrically Skin: Skin is warm and dry.  Psychiatric: Cooperative  ED Course  Procedures (including critical care time) Labs Review Labs Reviewed - No data to display  Imaging Review Dg Chest 2 View  12/14/2015  CLINICAL DATA:  Headache, cough and fever for 1 day EXAM: CHEST  2 VIEW COMPARISON:  11/20/2011 FINDINGS: Normal heart size, mediastinal contours, and pulmonary vascularity. Lungs clear. No pneumothorax. Bones unremarkable. IMPRESSION: Normal exam. No interval change. Electronically Signed   By: Lavonia Nava Song M.D.   On: 12/14/2015 12:32   I have personally reviewed and evaluated these images and lab results as part of my medical decision-making.   EKG Interpretation None      MDM   Final diagnoses:  Cough  Viral respiratory illness    50 year old male, otherwise healthy, who presents with one day of cough and fever. Is well-appearing and in no acute distress. Does have fever of 100.5 Fahrenheit, but remainder of vital signs are within normal limits. Low suspicion for serious systemic illness and presentation seems consistent with that of viral respiratory illness. Chest x-ray without infiltrate or other acute processes. I discussed supportive care for home. Strict return and follow-up instructions are reviewed. He expressed understanding of all discharge instructions and felt comfortable  with the plan of care.   Forde Dandy, MD 12/14/15 1246

## 2015-12-14 NOTE — Discharge Instructions (Signed)
You do not need antibiotics as you do not have pneumonia on your chest x-ray. Please rest, drink fluids, and take motrin and tylenol as needed for pain and fever.   Return for worsening symptoms, including difficulty breathing, confusion, vomiting and unable to keep down food/fluids, or any other symptoms concerning to you  Cough, Adult Coughing is a reflex that clears your throat and your airways. Coughing helps to heal and protect your lungs. It is normal to cough occasionally, but a cough that happens with other symptoms or lasts a long time may be a sign of a condition that needs treatment. A cough may last only 2-3 weeks (acute), or it may last longer than 8 weeks (chronic). CAUSES Coughing is commonly caused by:  Breathing in substances that irritate your lungs.  A viral or bacterial respiratory infection.  Allergies.  Asthma.  Postnasal drip.  Smoking.  Acid backing up from the stomach into the esophagus (gastroesophageal reflux).  Certain medicines.  Chronic lung problems, including COPD (or rarely, lung cancer).  Other medical conditions such as heart failure. HOME CARE INSTRUCTIONS  Pay attention to any changes in your symptoms. Take these actions to help with your discomfort:  Take medicines only as told by your health care provider.  If you were prescribed an antibiotic medicine, take it as told by your health care provider. Do not stop taking the antibiotic even if you start to feel better.  Talk with your health care provider before you take a cough suppressant medicine.  Drink enough fluid to keep your urine clear or pale yellow.  If the air is dry, use a cold steam vaporizer or humidifier in your bedroom or your home to help loosen secretions.  Avoid anything that causes you to cough at work or at home.  If your cough is worse at night, try sleeping in a semi-upright position.  Avoid cigarette smoke. If you smoke, quit smoking. If you need help quitting,  ask your health care provider.  Avoid caffeine.  Avoid alcohol.  Rest as needed. SEEK MEDICAL CARE IF:   You have new symptoms.  You cough up pus.  Your cough does not get better after 2-3 weeks, or your cough gets worse.  You cannot control your cough with suppressant medicines and you are losing sleep.  You develop pain that is getting worse or pain that is not controlled with pain medicines.  You have a fever.  You have unexplained weight loss.  You have night sweats. SEEK IMMEDIATE MEDICAL CARE IF:  You cough up blood.  You have difficulty breathing.  Your heartbeat is very fast.   This information is not intended to replace advice given to you by your health care provider. Make sure you discuss any questions you have with your health care provider.   Document Released: 03/29/2011 Document Revised: 06/21/2015 Document Reviewed: 12/07/2014 Elsevier Interactive Patient Education 2016 [employer]  Viral Infections A virus is a type of germ. Viruses can cause:  Minor sore throats.  Aches and pains.  Headaches.  Runny nose.  Rashes.  Watery eyes.  Tiredness.  Coughs.  Loss of appetite.  Feeling sick to your stomach (nausea).  Throwing up (vomiting).  Watery poop (diarrhea). HOME CARE   Only take medicines as told by your doctor.  Drink enough water and fluids to keep your pee (urine) clear or pale yellow. Sports drinks are a good choice.  Get plenty of rest and eat healthy. Soups and broths with crackers or  rice are fine. GET HELP RIGHT AWAY IF:   You have a very bad headache.  You have shortness of breath.  You have chest pain or neck pain.  You have an unusual rash.  You cannot stop throwing up.  You have watery poop that does not stop.  You cannot keep fluids down.  You or your child has a temperature by mouth above 102 F (38.9 C), not controlled by medicine.  Your baby is older than 3 months with a rectal temperature of  102 F (38.9 C) or higher.  Your baby is 29 months old or younger with a rectal temperature of 100.4 F (38 C) or higher. MAKE SURE YOU:   Understand these instructions.  Will watch this condition.  Will get help right away if you are not doing well or get worse.   This information is not intended to replace advice given to you by your health care provider. Make sure you discuss any questions you have with your health care provider.   Document Released: 09/12/2008 Document Revised: 12/23/2011 Document Reviewed: 03/08/2015 Elsevier Interactive Patient Education [employer].

## 2015-12-14 NOTE — ED Notes (Signed)
Pt c/o headache, cough, fever x 1 day

## 2016-05-01 DIAGNOSIS — Z125 Encounter for screening for malignant neoplasm of prostate: Secondary | ICD-10-CM | POA: Diagnosis not present

## 2016-05-01 DIAGNOSIS — Z0000 Encounter for general adult medical examination without abnormal findings: Secondary | ICD-10-CM | POA: Diagnosis not present

## 2016-05-07 DIAGNOSIS — Z0000 Encounter for general adult medical examination without abnormal findings: Secondary | ICD-10-CM | POA: Diagnosis not present

## 2016-05-14 DIAGNOSIS — Z130 Encounter for screening for diseases of the blood and blood-forming organs and certain disorders involving the immune mechanism: Secondary | ICD-10-CM | POA: Diagnosis not present

## 2016-05-14 DIAGNOSIS — Z13220 Encounter for screening for lipoid disorders: Secondary | ICD-10-CM | POA: Diagnosis not present

## 2016-06-14 DIAGNOSIS — Z0000 Encounter for general adult medical examination without abnormal findings: Secondary | ICD-10-CM | POA: Diagnosis not present

## 2016-06-14 DIAGNOSIS — E559 Vitamin D deficiency, unspecified: Secondary | ICD-10-CM | POA: Diagnosis not present

## 2016-06-14 DIAGNOSIS — K602 Anal fissure, unspecified: Secondary | ICD-10-CM | POA: Diagnosis not present

## 2016-06-14 DIAGNOSIS — T7849XD Other allergy, subsequent encounter: Secondary | ICD-10-CM | POA: Diagnosis not present

## 2016-07-24 DIAGNOSIS — K6289 Other specified diseases of anus and rectum: Secondary | ICD-10-CM | POA: Diagnosis not present

## 2016-08-12 DIAGNOSIS — K6289 Other specified diseases of anus and rectum: Secondary | ICD-10-CM | POA: Diagnosis not present

## 2016-08-12 DIAGNOSIS — K602 Anal fissure, unspecified: Secondary | ICD-10-CM | POA: Diagnosis not present

## 2016-09-18 DIAGNOSIS — K602 Anal fissure, unspecified: Secondary | ICD-10-CM | POA: Diagnosis not present

## 2016-09-18 DIAGNOSIS — K6289 Other specified diseases of anus and rectum: Secondary | ICD-10-CM | POA: Diagnosis not present

## 2016-10-16 DIAGNOSIS — M530 Cervicocranial syndrome: Secondary | ICD-10-CM | POA: Diagnosis not present

## 2016-10-16 DIAGNOSIS — M9901 Segmental and somatic dysfunction of cervical region: Secondary | ICD-10-CM | POA: Diagnosis not present

## 2016-10-21 DIAGNOSIS — M9901 Segmental and somatic dysfunction of cervical region: Secondary | ICD-10-CM | POA: Diagnosis not present

## 2016-10-21 DIAGNOSIS — M530 Cervicocranial syndrome: Secondary | ICD-10-CM | POA: Diagnosis not present

## 2016-10-23 DIAGNOSIS — M530 Cervicocranial syndrome: Secondary | ICD-10-CM | POA: Diagnosis not present

## 2016-10-23 DIAGNOSIS — M9901 Segmental and somatic dysfunction of cervical region: Secondary | ICD-10-CM | POA: Diagnosis not present

## 2016-10-28 DIAGNOSIS — M9901 Segmental and somatic dysfunction of cervical region: Secondary | ICD-10-CM | POA: Diagnosis not present

## 2016-10-28 DIAGNOSIS — M530 Cervicocranial syndrome: Secondary | ICD-10-CM | POA: Diagnosis not present

## 2016-11-04 DIAGNOSIS — M9901 Segmental and somatic dysfunction of cervical region: Secondary | ICD-10-CM | POA: Diagnosis not present

## 2016-11-04 DIAGNOSIS — M530 Cervicocranial syndrome: Secondary | ICD-10-CM | POA: Diagnosis not present

## 2016-11-11 DIAGNOSIS — M9901 Segmental and somatic dysfunction of cervical region: Secondary | ICD-10-CM | POA: Diagnosis not present

## 2016-11-11 DIAGNOSIS — M530 Cervicocranial syndrome: Secondary | ICD-10-CM | POA: Diagnosis not present

## 2016-11-18 DIAGNOSIS — M530 Cervicocranial syndrome: Secondary | ICD-10-CM | POA: Diagnosis not present

## 2016-11-18 DIAGNOSIS — M9901 Segmental and somatic dysfunction of cervical region: Secondary | ICD-10-CM | POA: Diagnosis not present

## 2016-12-02 DIAGNOSIS — M530 Cervicocranial syndrome: Secondary | ICD-10-CM | POA: Diagnosis not present

## 2016-12-02 DIAGNOSIS — M9901 Segmental and somatic dysfunction of cervical region: Secondary | ICD-10-CM | POA: Diagnosis not present

## 2016-12-16 DIAGNOSIS — K602 Anal fissure, unspecified: Secondary | ICD-10-CM | POA: Diagnosis not present

## 2016-12-16 DIAGNOSIS — K624 Stenosis of anus and rectum: Secondary | ICD-10-CM | POA: Diagnosis not present

## 2016-12-23 DIAGNOSIS — M530 Cervicocranial syndrome: Secondary | ICD-10-CM | POA: Diagnosis not present

## 2016-12-23 DIAGNOSIS — M9901 Segmental and somatic dysfunction of cervical region: Secondary | ICD-10-CM | POA: Diagnosis not present

## 2016-12-27 DIAGNOSIS — M47812 Spondylosis without myelopathy or radiculopathy, cervical region: Secondary | ICD-10-CM | POA: Diagnosis not present

## 2016-12-27 DIAGNOSIS — M545 Low back pain: Secondary | ICD-10-CM | POA: Diagnosis not present

## 2016-12-27 DIAGNOSIS — M5136 Other intervertebral disc degeneration, lumbar region: Secondary | ICD-10-CM | POA: Diagnosis not present

## 2017-02-06 DIAGNOSIS — R252 Cramp and spasm: Secondary | ICD-10-CM | POA: Diagnosis not present

## 2017-02-06 DIAGNOSIS — R5382 Chronic fatigue, unspecified: Secondary | ICD-10-CM | POA: Diagnosis not present

## 2017-02-06 DIAGNOSIS — M542 Cervicalgia: Secondary | ICD-10-CM | POA: Diagnosis not present

## 2017-02-06 DIAGNOSIS — R0683 Snoring: Secondary | ICD-10-CM | POA: Diagnosis not present

## 2017-02-14 DIAGNOSIS — M545 Low back pain: Secondary | ICD-10-CM | POA: Diagnosis not present

## 2017-02-14 DIAGNOSIS — M546 Pain in thoracic spine: Secondary | ICD-10-CM | POA: Diagnosis not present

## 2017-02-14 DIAGNOSIS — M542 Cervicalgia: Secondary | ICD-10-CM | POA: Diagnosis not present

## 2017-02-18 DIAGNOSIS — M19042 Primary osteoarthritis, left hand: Secondary | ICD-10-CM | POA: Diagnosis not present

## 2017-02-18 DIAGNOSIS — M25569 Pain in unspecified knee: Secondary | ICD-10-CM | POA: Diagnosis not present

## 2017-02-18 DIAGNOSIS — M545 Low back pain: Secondary | ICD-10-CM | POA: Diagnosis not present

## 2017-02-18 DIAGNOSIS — M1711 Unilateral primary osteoarthritis, right knee: Secondary | ICD-10-CM | POA: Diagnosis not present

## 2017-02-18 DIAGNOSIS — M2550 Pain in unspecified joint: Secondary | ICD-10-CM | POA: Diagnosis not present

## 2017-02-18 DIAGNOSIS — M47812 Spondylosis without myelopathy or radiculopathy, cervical region: Secondary | ICD-10-CM | POA: Diagnosis not present

## 2017-02-18 DIAGNOSIS — M549 Dorsalgia, unspecified: Secondary | ICD-10-CM | POA: Diagnosis not present

## 2017-02-18 DIAGNOSIS — M542 Cervicalgia: Secondary | ICD-10-CM | POA: Diagnosis not present

## 2017-02-18 DIAGNOSIS — M1712 Unilateral primary osteoarthritis, left knee: Secondary | ICD-10-CM | POA: Diagnosis not present

## 2017-02-18 DIAGNOSIS — M19041 Primary osteoarthritis, right hand: Secondary | ICD-10-CM | POA: Diagnosis not present

## 2017-02-19 ENCOUNTER — Encounter: Payer: Self-pay | Admitting: Neurology

## 2017-02-19 ENCOUNTER — Encounter (INDEPENDENT_AMBULATORY_CARE_PROVIDER_SITE_OTHER): Payer: Self-pay

## 2017-02-19 ENCOUNTER — Ambulatory Visit (INDEPENDENT_AMBULATORY_CARE_PROVIDER_SITE_OTHER): Payer: Federal, State, Local not specified - PPO | Admitting: Neurology

## 2017-02-19 VITALS — BP 118/70 | HR 58 | Resp 16 | Ht 69.0 in | Wt 190.0 lb

## 2017-02-19 DIAGNOSIS — R351 Nocturia: Secondary | ICD-10-CM

## 2017-02-19 DIAGNOSIS — G4719 Other hypersomnia: Secondary | ICD-10-CM | POA: Diagnosis not present

## 2017-02-19 DIAGNOSIS — R0683 Snoring: Secondary | ICD-10-CM

## 2017-02-19 NOTE — Patient Instructions (Signed)
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
## 2017-03-11 DIAGNOSIS — M7651 Patellar tendinitis, right knee: Secondary | ICD-10-CM | POA: Diagnosis not present

## 2017-03-11 DIAGNOSIS — M9252 Juvenile osteochondrosis of tibia and fibula, left leg: Secondary | ICD-10-CM | POA: Diagnosis not present

## 2017-03-11 DIAGNOSIS — G8929 Other chronic pain: Secondary | ICD-10-CM | POA: Diagnosis not present

## 2017-03-11 DIAGNOSIS — M25561 Pain in right knee: Secondary | ICD-10-CM | POA: Diagnosis not present

## 2017-03-11 DIAGNOSIS — M9251 Juvenile osteochondrosis of tibia and fibula, right leg: Secondary | ICD-10-CM | POA: Diagnosis not present

## 2017-03-11 DIAGNOSIS — M25562 Pain in left knee: Secondary | ICD-10-CM | POA: Diagnosis not present

## 2017-03-13 ENCOUNTER — Ambulatory Visit (INDEPENDENT_AMBULATORY_CARE_PROVIDER_SITE_OTHER): Payer: Federal, State, Local not specified - PPO | Admitting: Neurology

## 2017-03-13 DIAGNOSIS — G4720 Circadian rhythm sleep disorder, unspecified type: Secondary | ICD-10-CM

## 2017-03-13 DIAGNOSIS — G4733 Obstructive sleep apnea (adult) (pediatric): Secondary | ICD-10-CM | POA: Diagnosis not present

## 2017-03-17 NOTE — Procedures (Signed)
PATIENT'S NAME:  Peter Riley, Peter Riley DOB:      1966/05/21      MR#:    [id number]     DATE OF RECORDING: 03/13/2017 REFERRING M.D.:  Jani Gravel, MD Study Performed:   Baseline Polysomnogram HISTORY: 51 year old man with a history of allergic rhinitis, vocal polyps, status post polypectomy, and overweight state, who reports snoring and excessive daytime somnolence. The patient endorsed the Epworth Sleepiness Scale at 18 points. The patient's weight 190 pounds with a height of 69 (inches), resulting in a BMI of 28.1 kg/m2. The patient's neck circumference measured 15.8 inches. Of note, at home bedtime is reported to be 10:30 PM and wake time at 4:15 AM.  CURRENT MEDICATIONS: Flexeril   PROCEDURE:  This is a multichannel digital polysomnogram utilizing the Somnostar 11.2 system.  Electrodes and sensors were applied and monitored per AASM Specifications.   EEG, EOG, Chin and Limb EMG, were sampled at 200 Hz.  ECG, Snore and Nasal Pressure, Thermal Airflow, Respiratory Effort, CPAP Flow and Pressure, Oximetry was sampled at 50 Hz. Digital video and audio were recorded.      BASELINE STUDY  Lights Out was at 21:05 and Lights On at 04:59.  Total recording time (TRT) was 475 minutes, with a total sleep time (TST) of 381 minutes.   The patient's sleep latency was 27.5 minutes.  REM latency was 46 minutes, which is mildly reduced.  The sleep efficiency was 80.2 %.     SLEEP ARCHITECTURE: WASO (Wake after sleep onset) was 70.5 minutes with mild to moderate sleep fragmentation noted.  There were 44 minutes in Stage N1, 138.5 minutes Stage N2, 92.5 minutes Stage N3 and 106 minutes in Stage REM.  The percentage of Stage N1 was 11.5%, Stage N2 was 36.4%, which is reduced, Stage N3 was 24.3%, which is increased, and Stage R (REM sleep) was 27.8%, which is mildly increasd.  The arousals were noted as: 44 were spontaneous, 3 were associated with PLMs, 28 were associated with respiratory events.  Audio and video analysis did  not show any abnormal or unusual movements, behaviors, phonations or vocalizations.  The patient took 1 bathroom break. Mild intermittent snoring was noted. The EKG was in keeping with normal sinus rhythm (NSR).  RESPIRATORY ANALYSIS:  There were a total of 35 respiratory events:  1 obstructive apneas, 4 central apneas and 2 mixed apneas with a total of 7 apneas and an apnea index (AI) of 1.1 /hour. There were 28 hypopneas with a hypopnea index of 4.4 /hour. The patient also had 11 respiratory event related arousals (RERAs).      The total APNEA/HYPOPNEA INDEX (AHI) was 5.5/hour and the total RESPIRATORY DISTURBANCE INDEX was 7.2 /hour.  5 events occurred in REM sleep and 52 events in NREM. The REM AHI was 2.8 /hour, versus a non-REM AHI of 6.5. The patient spent 356 minutes of total sleep time in the supine position and 25 minutes in non-supine.. The supine AHI was 5.9 versus a non-supine AHI of 0.0.  OXYGEN SATURATION & C02:  The Wake baseline 02 saturation was 96%, with the lowest being 91%. Time spent below 89% saturation equaled 0 minutes.  PERIODIC LIMB MOVEMENTS:  The patient had a total of 17 Periodic Limb Movements.  The Periodic Limb Movement (PLM) index was 2.7 and the PLM Arousal index was .5/hour.    Post-study, the patient indicated that sleep was worse than usual.   IMPRESSION:  1. Obstructive Sleep Apnea (OSA) 2. Dysfunctions associated with sleep  stages or arousal from sleep  RECOMMENDATIONS:  1. This study demonstrates overall mild obstructive sleep apnea, with a total AHI of 5.5/hour. Given the patient's sleep related complaints, a trial of positive airway pressure treatment in the form of autoPAP is recommended to help snoring and daytime somnolence; this will be discuss with the patient. Other treatment options may include avoidance of supine sleep position along with weight loss, upper airway or jaw surgery in selected patients or the use of an oral appliance in certain  patients. ENT evaluation and/or consultation with a maxillofacial surgeon or dentist may be feasible in some instances.    2. This study shows sleep fragmentation and abnormal sleep stage percentages; these are nonspecific findings and per se do not signify an intrinsic sleep disorder or a cause for the patient's sleep-related symptoms. Causes include (but are not limited to) the first night effect of the sleep study, circadian rhythm disturbances, medication effect or an underlying mood disorder or medical problem. The patient had a mildly reduced REM sleep latency and mild increase in REM sleep; clinical correlation is recommended. Chronic sleep deprivation should be considered as of the causes for significant daytime somnolence.  3. The patient should be cautioned not to drive, work at heights, or operate dangerous or heavy equipment when tired or sleepy. Review and reiteration of good sleep hygiene measures should be pursued with any patient. 4. The patient will be seen in follow-up by Dr. Rexene Alberts at [facility] for discussion of the test results and further management strategies. The referring provider will be notified of the test results.  I certify that I have reviewed the entire raw data recording prior to the issuance of this report in accordance with the Standards of Accreditation of the American Academy of Sleep Medicine (AASM)   Star Age, MD, PhD Diplomat, American Board of Psychiatry and Neurology (Neurology and Sleep Medicine)

## 2017-03-17 NOTE — Addendum Note (Signed)
Addended by: Star Age on: 03/17/2017 08:08 AM   Modules accepted: Orders

## 2017-03-17 NOTE — Progress Notes (Signed)
Patient referred by Dr. Maudie Mercury, seen by me on 02/19/17, diagnostic PSG on 03/13/17.     Please call and notify the patient that the recent sleep study did confirm the diagnosis of obstructive sleep apnea. OSA is overall mild, but worth treating to see if he feels better after treatment, particularly with regards to his EDS. To that end I recommend treatment for this in the form of autoPAP, which means, that we don't have to bring him back for a second sleep study with CPAP, but will let him try an autoPAP machine at home, through a DME company (of his choice, or as per insurance requirement). The DME representative will educate him on how to use the machine, how to put the mask on, etc. I have placed an order in the chart. Please send referral, talk to patient, send report to PCP and referring MD. We will need a FU in sleep clinic for 10 weeks post-PAP set up, please arrange that as well, if needed with NP. Thanks,   Peter Age, MD, PhD [facility])

## 2017-03-20 ENCOUNTER — Telehealth: Payer: Self-pay

## 2017-03-20 NOTE — Telephone Encounter (Signed)
Referral sent to [facility].

## 2017-03-20 NOTE — Telephone Encounter (Signed)
I called Peter Riley. I advised Peter Riley that Dr. Rexene Alberts reviewed their sleep study results and found that Peter Riley has sleep apnea. Dr. Rexene Alberts recommends that Peter Riley be started on Auto cpap. I reviewed PAP compliance expectations with the Peter Riley. Peter Riley is agreeable to starting a CPAP. I advised Peter Riley that an order will be sent to a DME, [facility], and they will call the Peter Riley within about one week after they file with the Peter Riley's insurance. They will show the Peter Riley how to use the machine, fit for masks, and troubleshoot the CPAP if needed. A follow up appt was made for insurance purposes with Dr. Rexene Alberts on September 10th at 3:00. Peter Riley verbalized understanding to arrive 15 minutes early and bring their CPAP. A letter with all of this information in it will be mailed to the Peter Riley as a reminder. I verified with the Peter Riley that the address we have on file is correct. Peter Riley verbalized understanding of results. Peter Riley had no questions at this time but was encouraged to call back if questions arise.

## 2017-03-24 DIAGNOSIS — M7652 Patellar tendinitis, left knee: Secondary | ICD-10-CM | POA: Diagnosis not present

## 2017-03-24 DIAGNOSIS — M7651 Patellar tendinitis, right knee: Secondary | ICD-10-CM | POA: Diagnosis not present

## 2017-04-03 DIAGNOSIS — M7652 Patellar tendinitis, left knee: Secondary | ICD-10-CM | POA: Diagnosis not present

## 2017-04-03 DIAGNOSIS — M7651 Patellar tendinitis, right knee: Secondary | ICD-10-CM | POA: Diagnosis not present

## 2017-04-14 DIAGNOSIS — G4733 Obstructive sleep apnea (adult) (pediatric): Secondary | ICD-10-CM | POA: Diagnosis not present

## 2017-04-22 DIAGNOSIS — M7651 Patellar tendinitis, right knee: Secondary | ICD-10-CM | POA: Diagnosis not present

## 2017-04-22 DIAGNOSIS — M7652 Patellar tendinitis, left knee: Secondary | ICD-10-CM | POA: Diagnosis not present

## 2017-04-22 DIAGNOSIS — M25562 Pain in left knee: Secondary | ICD-10-CM | POA: Diagnosis not present

## 2017-04-22 DIAGNOSIS — M25561 Pain in right knee: Secondary | ICD-10-CM | POA: Diagnosis not present

## 2017-05-01 DIAGNOSIS — Z125 Encounter for screening for malignant neoplasm of prostate: Secondary | ICD-10-CM | POA: Diagnosis not present

## 2017-05-01 DIAGNOSIS — Z0000 Encounter for general adult medical examination without abnormal findings: Secondary | ICD-10-CM | POA: Diagnosis not present

## 2017-05-08 DIAGNOSIS — Z0000 Encounter for general adult medical examination without abnormal findings: Secondary | ICD-10-CM | POA: Diagnosis not present

## 2017-05-15 DIAGNOSIS — G4733 Obstructive sleep apnea (adult) (pediatric): Secondary | ICD-10-CM | POA: Diagnosis not present

## 2017-05-30 IMAGING — CR DG CHEST 2V
2 series · 2 of 2 positions shown · non-contrast
Comparison: 11/20/2011

CLINICAL DATA: Headache, cough and fever for 1 day

EXAM:
CHEST  2 VIEW

[w chest pa]
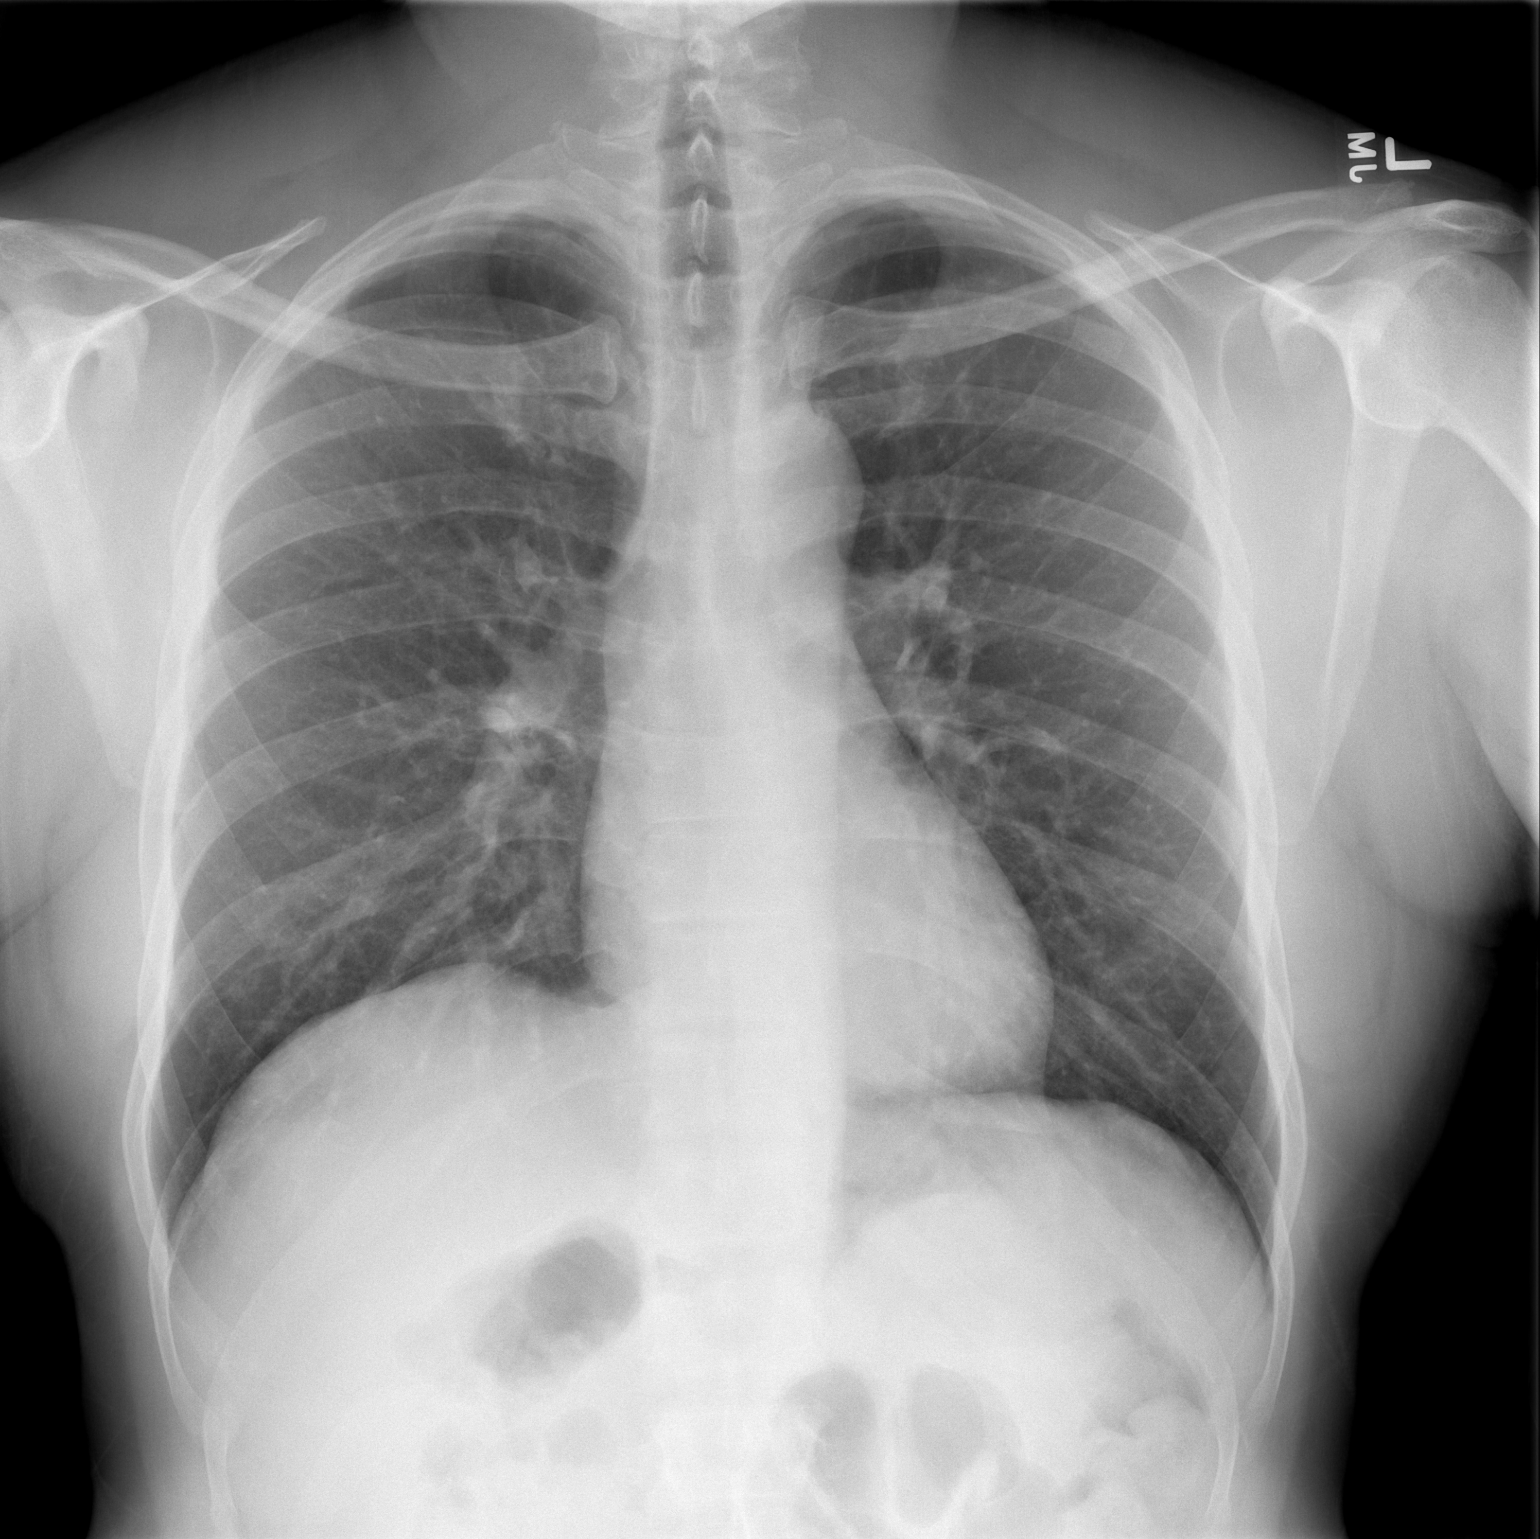

[w chest lat]
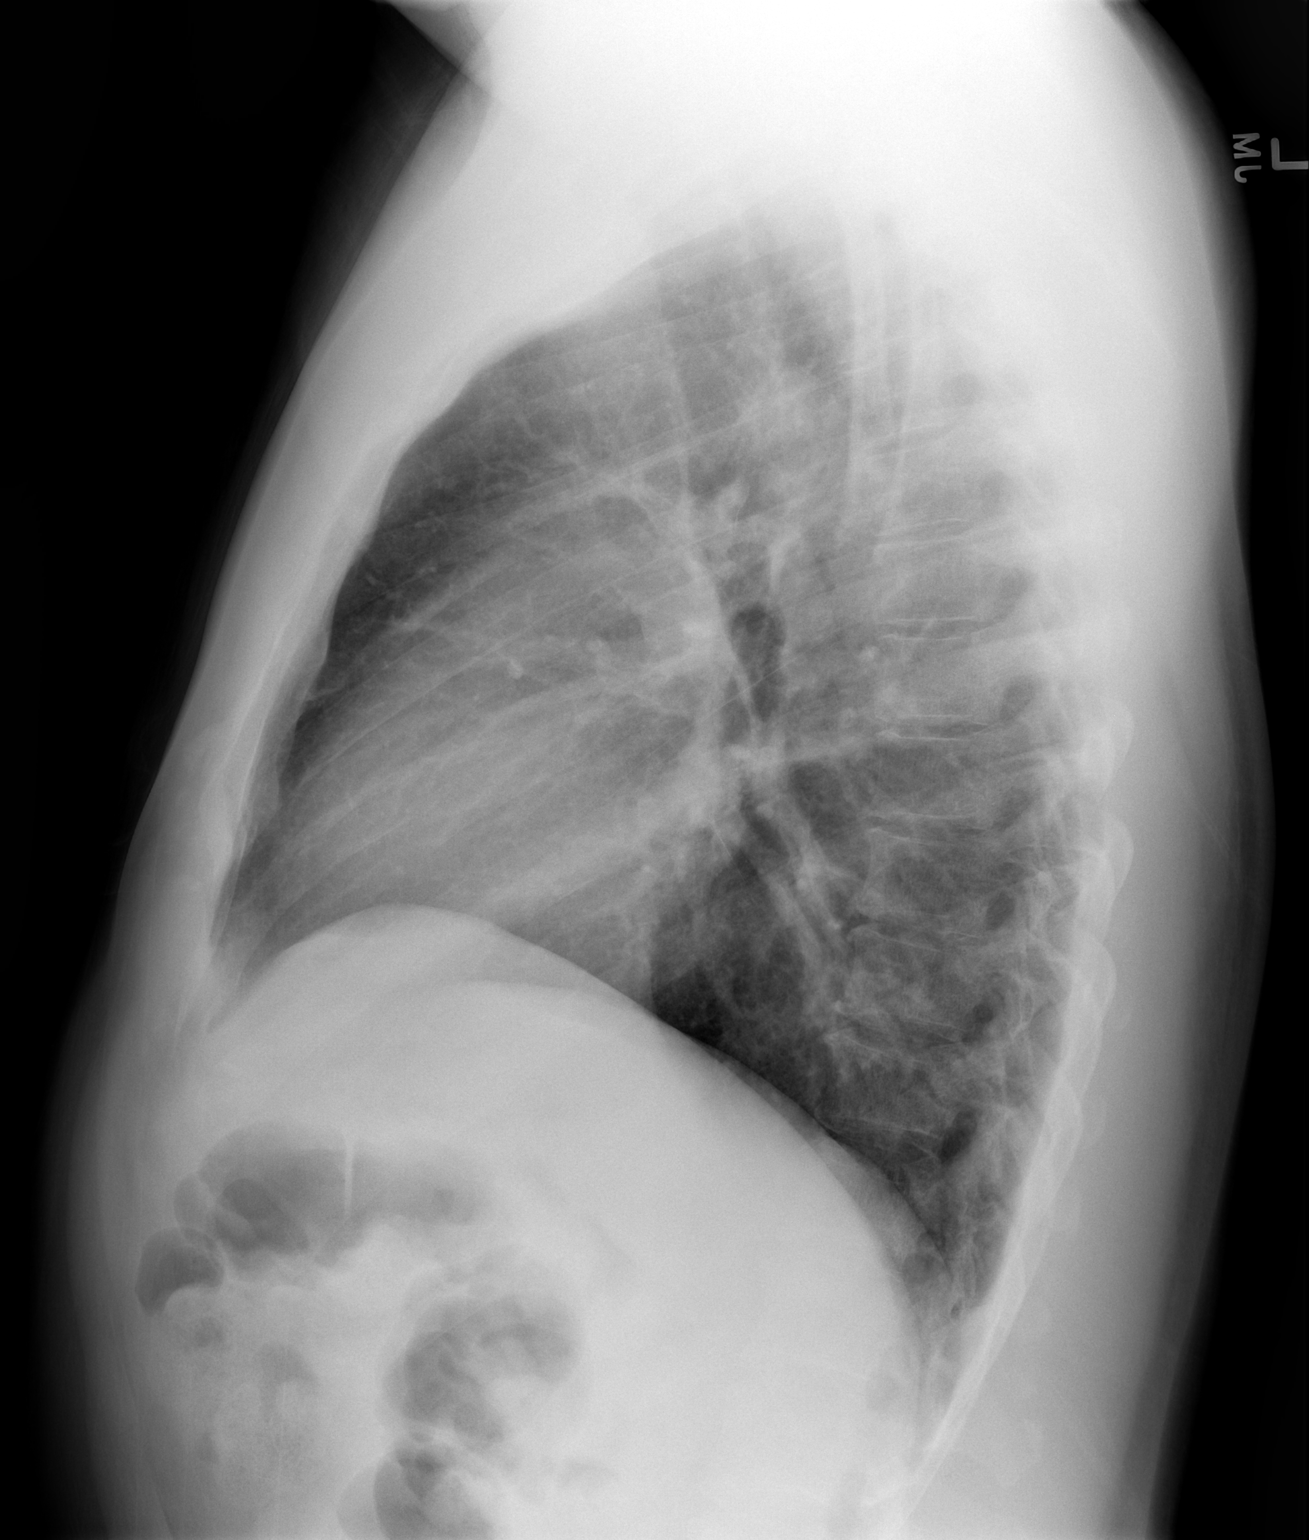

[2 of 2 positions shown; findings below may reference images not displayed]

FINDINGS: Normal heart size, mediastinal contours, and pulmonary vascularity.

Lungs clear.

No pneumothorax.

Bones unremarkable.
IMPRESSION: Normal exam.

No interval change.

## 2017-06-10 DIAGNOSIS — Z0189 Encounter for other specified special examinations: Secondary | ICD-10-CM | POA: Diagnosis not present

## 2017-06-15 DIAGNOSIS — G4733 Obstructive sleep apnea (adult) (pediatric): Secondary | ICD-10-CM | POA: Diagnosis not present

## 2017-06-17 DIAGNOSIS — G894 Chronic pain syndrome: Secondary | ICD-10-CM | POA: Diagnosis not present

## 2017-06-17 DIAGNOSIS — F4312 Post-traumatic stress disorder, chronic: Secondary | ICD-10-CM | POA: Diagnosis not present

## 2017-06-17 DIAGNOSIS — H5210 Myopia, unspecified eye: Secondary | ICD-10-CM | POA: Diagnosis not present

## 2017-06-20 DIAGNOSIS — H47233 Glaucomatous optic atrophy, bilateral: Secondary | ICD-10-CM | POA: Diagnosis not present

## 2017-06-20 DIAGNOSIS — H524 Presbyopia: Secondary | ICD-10-CM | POA: Diagnosis not present

## 2017-06-23 ENCOUNTER — Ambulatory Visit: Payer: Self-pay | Admitting: Neurology

## 2017-07-11 DIAGNOSIS — M65871 Other synovitis and tenosynovitis, right ankle and foot: Secondary | ICD-10-CM | POA: Diagnosis not present

## 2017-07-11 DIAGNOSIS — M7661 Achilles tendinitis, right leg: Secondary | ICD-10-CM | POA: Diagnosis not present

## 2017-07-11 DIAGNOSIS — M79671 Pain in right foot: Secondary | ICD-10-CM | POA: Diagnosis not present

## 2017-07-11 DIAGNOSIS — M2021 Hallux rigidus, right foot: Secondary | ICD-10-CM | POA: Diagnosis not present

## 2017-07-11 DIAGNOSIS — M2022 Hallux rigidus, left foot: Secondary | ICD-10-CM | POA: Diagnosis not present

## 2017-07-15 DIAGNOSIS — G4733 Obstructive sleep apnea (adult) (pediatric): Secondary | ICD-10-CM | POA: Diagnosis not present

## 2017-07-22 DIAGNOSIS — H53149 Visual discomfort, unspecified: Secondary | ICD-10-CM | POA: Diagnosis not present

## 2017-07-22 DIAGNOSIS — H524 Presbyopia: Secondary | ICD-10-CM | POA: Diagnosis not present

## 2017-08-01 DIAGNOSIS — M71571 Other bursitis, not elsewhere classified, right ankle and foot: Secondary | ICD-10-CM | POA: Diagnosis not present

## 2017-08-01 DIAGNOSIS — M7661 Achilles tendinitis, right leg: Secondary | ICD-10-CM | POA: Diagnosis not present

## 2017-08-15 DIAGNOSIS — G4733 Obstructive sleep apnea (adult) (pediatric): Secondary | ICD-10-CM | POA: Diagnosis not present

## 2017-08-27 DIAGNOSIS — R490 Dysphonia: Secondary | ICD-10-CM | POA: Diagnosis not present

## 2017-08-27 DIAGNOSIS — J029 Acute pharyngitis, unspecified: Secondary | ICD-10-CM | POA: Diagnosis not present

## 2017-09-14 DIAGNOSIS — G4733 Obstructive sleep apnea (adult) (pediatric): Secondary | ICD-10-CM | POA: Diagnosis not present

## 2017-10-15 DIAGNOSIS — G4733 Obstructive sleep apnea (adult) (pediatric): Secondary | ICD-10-CM | POA: Diagnosis not present

## 2017-10-20 ENCOUNTER — Emergency Department (INDEPENDENT_AMBULATORY_CARE_PROVIDER_SITE_OTHER)
Admission: EM | Admit: 2017-10-20 | Discharge: 2017-10-20 | Disposition: A | Discharge status: Home / Self Care | Payer: Federal, State, Local not specified - PPO | Source: Home / Self Care | Attending: Family Medicine | Admitting: Family Medicine

## 2017-10-20 ENCOUNTER — Encounter: Payer: Self-pay | Admitting: *Deleted

## 2017-10-20 ENCOUNTER — Other Ambulatory Visit: Payer: Self-pay

## 2017-10-20 DIAGNOSIS — J069 Acute upper respiratory infection, unspecified: Secondary | ICD-10-CM

## 2017-10-20 DIAGNOSIS — J019 Acute sinusitis, unspecified: Secondary | ICD-10-CM

## 2017-10-20 MED ORDER — AMOXICILLIN-POT CLAVULANATE 875-125 MG PO TABS: 1.0000 | ORAL_TABLET | Freq: Two times a day (BID) | ORAL | 0 refills | Status: DC

## 2017-10-20 NOTE — Discharge Instructions (Signed)
°  You may take 500mg acetaminophen every 4-6 hours or in combination with ibuprofen 400-600mg every 6-8 hours as needed for pain, inflammation, and fever. ° °Be sure to drink at least eight 8oz glasses of water to stay well hydrated and get at least 8 hours of sleep at night, preferably more while sick.  ° °Please take antibiotics as prescribed and be sure to complete entire course even if you start to feel better to ensure infection does not come back. ° °

## 2017-10-20 NOTE — ED Triage Notes (Signed)
Patient c/o nasal congestion x 1 month with green and yellow phlegm. Taken zyrtec, mucinex and sudafed otc.

## 2017-10-20 NOTE — ED Provider Notes (Signed)
Peter Riley [facility]    CSN: [id number] Arrival date & time: 10/20/17  1512     History   Chief Complaint Chief Complaint  Patient presents with  . Nasal Congestion  . Cough    HPI Peter Riley is a 52 y.o. male.   HPI Peter Riley is a 52 y.o. male presenting to UC with c/o nasal congestion for about 1 month that has developed into sinus pain and pressure with green and yellow phlegm.  He has been taking zyrtec, mucinex and sudafed with mild relief.  His wife was seen at [facility] recently for similar symptoms, and improved quickly after given an antibiotic.  Denies fever, chills, n/v/d. Hx of sinus infections in the past.  Last one was a few months ago. Denies chest pain or SOB. Cough is minimal.    History reviewed. No pertinent past medical history.  Patient Active Problem List   Diagnosis Date Noted  . Left fourth proximal interphalangeal joint pain 06/10/2012    Past Surgical History:  Procedure Laterality Date  . Vocal polyp removal     x6       Home Medications    Prior to Admission medications   Medication Sig Start Date End Date Taking? Authorizing Provider  amoxicillin-clavulanate (AUGMENTIN) 875-125 MG tablet Take 1 tablet by mouth 2 (two) times daily. One po bid x 7 days 10/20/17   Peter Riley    Family History History reviewed. No pertinent family history.  Social History Social History   Tobacco Use  . Smoking status: Never Smoker  . Smokeless tobacco: Never Used  Substance Use Topics  . Alcohol use: No  . Drug use: No     Allergies   Codeine and Other   Review of Systems Review of Systems  Constitutional: Negative for chills and fever.  HENT: Positive for congestion, postnasal drip, sinus pressure, sinus pain and sore throat. Negative for ear pain, trouble swallowing and voice change.   Respiratory: Positive for cough. Negative for shortness of breath.   Cardiovascular: Negative for chest pain and palpitations.    Gastrointestinal: Negative for abdominal pain, diarrhea, nausea and vomiting.  Musculoskeletal: Negative for arthralgias, back pain and myalgias.  Skin: Negative for rash.  Neurological: Positive for headaches. Negative for dizziness and light-headedness.     Physical Exam Triage Vital Signs ED Triage Vitals  Enc Vitals Group     BP 10/20/17 1534 117/75     Pulse Rate 10/20/17 1534 64     Resp 10/20/17 1534 14     Temp 10/20/17 1534 98.2 F (36.8 C)     Temp Source 10/20/17 1534 Oral     SpO2 10/20/17 1534 97 %     Weight 10/20/17 1535 191 lb (86.6 kg)     Height --      Head Circumference --      Peak Flow --      Pain Score 10/20/17 1535 0     Pain Loc --      Pain Edu? --      Excl. in [place]? --    No data found.  Updated Vital Signs BP 117/75 (BP Location: Left Arm)   Pulse 64   Temp 98.2 F (36.8 C) (Oral)   Resp 14   Wt 191 lb (86.6 kg)   SpO2 97%   BMI 28.21 kg/m   Visual Acuity Right Eye Distance:   Left Eye Distance:   Bilateral Distance:    Right Eye Near:  Left Eye Near:    Bilateral Near:     Physical Exam  Constitutional: He is oriented to person, place, and time. He appears well-developed and well-nourished. No distress.  HENT:  Head: Normocephalic and atraumatic.  Right Ear: Tympanic membrane normal.  Left Ear: Tympanic membrane normal.  Nose: Mucosal edema present. Right sinus exhibits maxillary sinus tenderness and frontal sinus tenderness. Left sinus exhibits maxillary sinus tenderness and frontal sinus tenderness.  Mouth/Throat: Uvula is midline, oropharynx is clear and moist and mucous membranes are normal.  Eyes: EOM are normal.  Neck: Normal range of motion. Neck supple.  Cardiovascular: Normal rate and regular rhythm.  Pulmonary/Chest: Effort normal and breath sounds normal. No stridor. No respiratory distress. He has no wheezes. He has no rales.  Musculoskeletal: Normal range of motion.  Lymphadenopathy:    He has no cervical  adenopathy.  Neurological: He is alert and oriented to person, place, and time.  Skin: Skin is warm and dry. He is not diaphoretic.  Psychiatric: He has a normal mood and affect. His behavior is normal.  Nursing note and vitals reviewed.    UC Treatments / Results  Labs (all labs ordered are listed, but only abnormal results are displayed) Labs Reviewed - No data to display  EKG  EKG Interpretation None       Radiology No results found.  Procedures Procedures (including critical care time)  Medications Ordered in UC Medications - No data to display   Initial Impression / Assessment and Plan / UC Course  I have reviewed the triage vital signs and the nursing notes.  Pertinent labs & imaging results that were available during my care of the patient were reviewed by me and considered in my medical decision making (see chart for details).     Hx and exam c/w bacterial sinusitis given duration of worsening symptoms despite symptomatic treatment F/u with PCP in 7-10 days if not improving.   Final Clinical Impressions(s) / UC Diagnoses   Final diagnoses:  Upper respiratory tract infection, unspecified type  Acute rhinosinusitis    ED Discharge Orders        Ordered    amoxicillin-clavulanate (AUGMENTIN) 875-125 MG tablet  2 times daily     10/20/17 1628       Controlled Substance Prescriptions [place] Controlled Substance Registry consulted? Not Applicable   Peter Riley 10/20/17 4492

## 2017-10-24 DIAGNOSIS — M542 Cervicalgia: Secondary | ICD-10-CM | POA: Diagnosis not present

## 2017-10-24 DIAGNOSIS — M62830 Muscle spasm of back: Secondary | ICD-10-CM | POA: Diagnosis not present

## 2017-10-24 DIAGNOSIS — M546 Pain in thoracic spine: Secondary | ICD-10-CM | POA: Diagnosis not present

## 2017-10-24 DIAGNOSIS — M545 Low back pain: Secondary | ICD-10-CM | POA: Diagnosis not present

## 2017-11-12 DIAGNOSIS — J309 Allergic rhinitis, unspecified: Secondary | ICD-10-CM | POA: Diagnosis not present

## 2017-11-15 DIAGNOSIS — G4733 Obstructive sleep apnea (adult) (pediatric): Secondary | ICD-10-CM | POA: Diagnosis not present

## 2017-12-11 DIAGNOSIS — G4733 Obstructive sleep apnea (adult) (pediatric): Secondary | ICD-10-CM | POA: Diagnosis not present

## 2017-12-11 DIAGNOSIS — I10 Essential (primary) hypertension: Secondary | ICD-10-CM | POA: Diagnosis not present

## 2017-12-13 DIAGNOSIS — G4733 Obstructive sleep apnea (adult) (pediatric): Secondary | ICD-10-CM | POA: Diagnosis not present

## 2017-12-17 DIAGNOSIS — F329 Major depressive disorder, single episode, unspecified: Secondary | ICD-10-CM | POA: Diagnosis not present

## 2017-12-17 DIAGNOSIS — F419 Anxiety disorder, unspecified: Secondary | ICD-10-CM | POA: Diagnosis not present

## 2018-01-13 DIAGNOSIS — G4733 Obstructive sleep apnea (adult) (pediatric): Secondary | ICD-10-CM | POA: Diagnosis not present

## 2018-01-16 DIAGNOSIS — F419 Anxiety disorder, unspecified: Secondary | ICD-10-CM | POA: Diagnosis not present

## 2018-01-16 DIAGNOSIS — F331 Major depressive disorder, recurrent, moderate: Secondary | ICD-10-CM | POA: Diagnosis not present

## 2018-01-21 DIAGNOSIS — F419 Anxiety disorder, unspecified: Secondary | ICD-10-CM | POA: Diagnosis not present

## 2018-01-21 DIAGNOSIS — F331 Major depressive disorder, recurrent, moderate: Secondary | ICD-10-CM | POA: Diagnosis not present

## 2018-02-27 ENCOUNTER — Emergency Department
Admission: EM | Admit: 2018-02-27 | Discharge: 2018-02-27 | Disposition: A | Discharge status: Home / Self Care | Payer: Federal, State, Local not specified - PPO | Source: Home / Self Care | Attending: Family Medicine | Admitting: Family Medicine

## 2018-02-27 ENCOUNTER — Other Ambulatory Visit: Payer: Self-pay

## 2018-02-27 ENCOUNTER — Encounter: Payer: Self-pay | Admitting: Emergency Medicine

## 2018-02-27 DIAGNOSIS — R0981 Nasal congestion: Secondary | ICD-10-CM

## 2018-02-27 DIAGNOSIS — J302 Other seasonal allergic rhinitis: Secondary | ICD-10-CM

## 2018-02-27 HISTORY — DX: Other seasonal allergic rhinitis: J30.2

## 2018-02-27 MED ORDER — LEVOCETIRIZINE DIHYDROCHLORIDE 5 MG PO TABS: 5.0000 mg | ORAL_TABLET | Freq: Every evening | ORAL | 0 refills | Status: DC

## 2018-02-27 MED ORDER — IPRATROPIUM BROMIDE 0.06 % NA SOLN: 2.0000 | Freq: Four times a day (QID) | NASAL | 1 refills | Status: DC

## 2018-02-27 NOTE — ED Provider Notes (Signed)
[facility]    CSN: [id number] Arrival date & time: 02/27/18  0804     History   Chief Complaint Chief Complaint  Patient presents with  . Nasal Congestion    HPI Peter Riley is a 52 y.o. male.   HPI Peter Riley is a 52 y.o. male presenting to UC with c/o several weeks of nasal congestion. Minimal cough from post-nasal drip. Denies HA or facial pain.  Denies fever, chills, n/v/d. Denies ear pain or throat pain at this time. He did receive a nasal spray from the [place] but has not used it recently. He also take Zyrtec but has not used that recently either. He does use a CPAP machine at night but has never cleaned it.     Past Medical History:  Diagnosis Date  . Allergic rhinitis, seasonal     Patient Active Problem List   Diagnosis Date Noted  . Left fourth proximal interphalangeal joint pain 06/10/2012    Past Surgical History:  Procedure Laterality Date  . Vocal polyp removal     x6       Home Medications    Prior to Admission medications   Medication Sig Start Date End Date Taking? Authorizing Provider  amoxicillin-clavulanate (AUGMENTIN) 875-125 MG tablet Take 1 tablet by mouth 2 (two) times daily. One po bid x 7 days 10/20/17   Noe Gens, PA-C  ipratropium (ATROVENT) 0.06 % nasal spray Place 2 sprays into both nostrils 4 (four) times daily. 02/27/18   Noe Gens, PA-C  levocetirizine (XYZAL) 5 MG tablet Take 1 tablet (5 mg total) by mouth every evening. 02/27/18   Noe Gens, PA-C    Family History History reviewed. No pertinent family history.  Social History Social History   Tobacco Use  . Smoking status: Never Smoker  . Smokeless tobacco: Never Used  Substance Use Topics  . Alcohol use: No  . Drug use: No     Allergies   Codeine and Other   Review of Systems Review of Systems  Constitutional: Negative for chills and fever.  HENT: Positive for congestion, postnasal drip and rhinorrhea. Negative for ear pain, sinus  pressure, sinus pain, sneezing and sore throat.   Respiratory: Positive for cough. Negative for shortness of breath and wheezing.   Neurological: Negative for dizziness, light-headedness and headaches.     Physical Exam Triage Vital Signs ED Triage Vitals  Enc Vitals Group     BP 02/27/18 0822 117/75     Pulse Rate 02/27/18 0822 65     Resp 02/27/18 0822 16     Temp 02/27/18 0822 97.7 F (36.5 C)     Temp Source 02/27/18 0822 Oral     SpO2 02/27/18 0822 98 %     Weight 02/27/18 0823 190 lb (86.2 kg)     Height 02/27/18 0823 5\' 9"  (1.753 m)     Head Circumference --      Peak Flow --      Pain Score 02/27/18 0823 0     Pain Loc --      Pain Edu? --      Excl. in [place]? --    No data found.  Updated Vital Signs BP 117/75 (BP Location: Right Arm)   Pulse 65   Temp 97.7 F (36.5 C) (Oral)   Resp 16   Ht 5\' 9"  (1.753 m)   Wt 190 lb (86.2 kg)   SpO2 98%   BMI 28.06 kg/m   Visual  Acuity Right Eye Distance:   Left Eye Distance:   Bilateral Distance:    Right Eye Near:   Left Eye Near:    Bilateral Near:     Physical Exam  Constitutional: He is oriented to person, place, and time. He appears well-developed and well-nourished.  HENT:  Head: Normocephalic and atraumatic.  Right Ear: Tympanic membrane normal.  Left Ear: Tympanic membrane normal.  Nose: Mucosal edema present. Right sinus exhibits no maxillary sinus tenderness and no frontal sinus tenderness. Left sinus exhibits no maxillary sinus tenderness and no frontal sinus tenderness.  Mouth/Throat: Uvula is midline, oropharynx is clear and moist and mucous membranes are normal.  Eyes: EOM are normal.  Neck: Normal range of motion. Neck supple.  Cardiovascular: Normal rate and regular rhythm.  Pulmonary/Chest: Effort normal and breath sounds normal. No stridor. No respiratory distress. He has no wheezes. He has no rales.  Musculoskeletal: Normal range of motion.  Lymphadenopathy:    He has no cervical adenopathy.    Neurological: He is alert and oriented to person, place, and time.  Skin: Skin is warm and dry.  Psychiatric: He has a normal mood and affect. His behavior is normal.  Nursing note and vitals reviewed.    UC Treatments / Results  Labs (all labs ordered are listed, but only abnormal results are displayed) Labs Reviewed - No data to display  EKG None  Radiology No results found.  Procedures Procedures (including critical care time)  Medications Ordered in UC Medications - No data to display  Initial Impression / Assessment and Plan / UC Course  I have reviewed the triage vital signs and the nursing notes.  Pertinent labs & imaging results that were available during my care of the patient were reviewed by me and considered in my medical decision making (see chart for details).     Hx and exam c/w allergic rhinitis.  Pt requested disability form from [place] be signed.  Today's visit did not involve disability.  Generic work note from [facility] provided, pt may return to work today.   Home care instructions provided below.    Final Clinical Impressions(s) / UC Diagnoses   Final diagnoses:  Nasal congestion  Seasonal allergic rhinitis, unspecified trigger     Discharge Instructions      Please take allergy medication daily as prescribed to help with nasal congestion and to help prevent sinus infections from chronic congestion.   Sinus rinses such as a Secondary school teacher (found at [employer]) can be helpful.  Be sure to clean your CPAP machine regularly as indicated by the manufacturer.  If you have questions about how to clean your machine, please speak with the prescribing provider or company who is issuing the machine.  If not cleaned properly, bacteria can grow, leading to chronic congestion and even illness.    ED Prescriptions    Medication Sig Dispense Auth. Provider   ipratropium (ATROVENT) 0.06 % nasal spray Place 2 sprays into both nostrils 4 (four) times daily. 15 mL  Gerarda Fraction, Charlee Squibb O, PA-C   levocetirizine (XYZAL) 5 MG tablet Take 1 tablet (5 mg total) by mouth every evening. 30 tablet Noe Gens, PA-C     Controlled Substance Prescriptions [facility] Controlled Substance Registry consulted? Not Applicable   [name] 02/27/18 8101

## 2018-02-27 NOTE — Discharge Instructions (Signed)
°  Please take allergy medication daily as prescribed to help with nasal congestion and to help prevent sinus infections from chronic congestion.   Sinus rinses such as a Secondary school teacher (found at [employer]) can be helpful.  Be sure to clean your CPAP machine regularly as indicated by the manufacturer.  If you have questions about how to clean your machine, please speak with the prescribing provider or company who is issuing the machine.  If not cleaned properly, bacteria can grow, leading to chronic congestion and even illness.

## 2018-02-27 NOTE — ED Triage Notes (Signed)
Patient has been congested with nasal drainage for about 2 weeks. He uses CPAP and has never cleaned it; he has UR allergies and takes zyrtec off and on.

## 2018-03-19 DIAGNOSIS — F4310 Post-traumatic stress disorder, unspecified: Secondary | ICD-10-CM | POA: Diagnosis not present

## 2018-03-26 DIAGNOSIS — G4733 Obstructive sleep apnea (adult) (pediatric): Secondary | ICD-10-CM | POA: Diagnosis not present

## 2018-03-26 DIAGNOSIS — E669 Obesity, unspecified: Secondary | ICD-10-CM | POA: Diagnosis not present

## 2018-03-26 DIAGNOSIS — M25551 Pain in right hip: Secondary | ICD-10-CM | POA: Diagnosis not present

## 2018-03-26 DIAGNOSIS — M1611 Unilateral primary osteoarthritis, right hip: Secondary | ICD-10-CM | POA: Diagnosis not present

## 2018-03-26 DIAGNOSIS — M25552 Pain in left hip: Secondary | ICD-10-CM | POA: Diagnosis not present

## 2018-04-29 DIAGNOSIS — F419 Anxiety disorder, unspecified: Secondary | ICD-10-CM | POA: Diagnosis not present

## 2018-04-29 DIAGNOSIS — F329 Major depressive disorder, single episode, unspecified: Secondary | ICD-10-CM | POA: Diagnosis not present

## 2018-04-29 DIAGNOSIS — F4310 Post-traumatic stress disorder, unspecified: Secondary | ICD-10-CM | POA: Diagnosis not present

## 2018-05-04 DIAGNOSIS — Z125 Encounter for screening for malignant neoplasm of prostate: Secondary | ICD-10-CM | POA: Diagnosis not present

## 2018-05-04 DIAGNOSIS — Z0000 Encounter for general adult medical examination without abnormal findings: Secondary | ICD-10-CM | POA: Diagnosis not present

## 2018-05-11 DIAGNOSIS — Z0000 Encounter for general adult medical examination without abnormal findings: Secondary | ICD-10-CM | POA: Diagnosis not present

## 2018-05-12 DIAGNOSIS — F4310 Post-traumatic stress disorder, unspecified: Secondary | ICD-10-CM | POA: Diagnosis not present

## 2018-05-19 DIAGNOSIS — F4310 Post-traumatic stress disorder, unspecified: Secondary | ICD-10-CM | POA: Diagnosis not present

## 2018-05-22 DIAGNOSIS — Z9989 Dependence on other enabling machines and devices: Secondary | ICD-10-CM | POA: Diagnosis not present

## 2018-05-22 DIAGNOSIS — G4733 Obstructive sleep apnea (adult) (pediatric): Secondary | ICD-10-CM | POA: Diagnosis not present

## 2018-05-26 DIAGNOSIS — F4310 Post-traumatic stress disorder, unspecified: Secondary | ICD-10-CM | POA: Diagnosis not present

## 2018-06-02 DIAGNOSIS — F4310 Post-traumatic stress disorder, unspecified: Secondary | ICD-10-CM | POA: Diagnosis not present

## 2018-06-04 DIAGNOSIS — Z0189 Encounter for other specified special examinations: Secondary | ICD-10-CM | POA: Diagnosis not present

## 2018-06-24 DIAGNOSIS — Z1211 Encounter for screening for malignant neoplasm of colon: Secondary | ICD-10-CM | POA: Diagnosis not present

## 2018-06-26 DIAGNOSIS — M25552 Pain in left hip: Secondary | ICD-10-CM | POA: Diagnosis not present

## 2018-06-26 DIAGNOSIS — M1611 Unilateral primary osteoarthritis, right hip: Secondary | ICD-10-CM | POA: Diagnosis not present

## 2018-06-26 DIAGNOSIS — F4312 Post-traumatic stress disorder, chronic: Secondary | ICD-10-CM | POA: Diagnosis not present

## 2018-06-26 DIAGNOSIS — G4733 Obstructive sleep apnea (adult) (pediatric): Secondary | ICD-10-CM | POA: Diagnosis not present

## 2018-07-09 DIAGNOSIS — H15101 Unspecified episcleritis, right eye: Secondary | ICD-10-CM | POA: Diagnosis not present

## 2018-07-16 DIAGNOSIS — Z1211 Encounter for screening for malignant neoplasm of colon: Secondary | ICD-10-CM | POA: Diagnosis not present

## 2018-07-16 DIAGNOSIS — K5730 Diverticulosis of large intestine without perforation or abscess without bleeding: Secondary | ICD-10-CM | POA: Diagnosis not present

## 2018-07-28 DIAGNOSIS — G4733 Obstructive sleep apnea (adult) (pediatric): Secondary | ICD-10-CM | POA: Diagnosis not present

## 2018-10-13 DIAGNOSIS — H04123 Dry eye syndrome of bilateral lacrimal glands: Secondary | ICD-10-CM | POA: Diagnosis not present

## 2018-10-23 DIAGNOSIS — H15101 Unspecified episcleritis, right eye: Secondary | ICD-10-CM | POA: Diagnosis not present

## 2018-10-23 DIAGNOSIS — H524 Presbyopia: Secondary | ICD-10-CM | POA: Diagnosis not present

## 2018-10-23 DIAGNOSIS — H02051 Trichiasis without entropian right upper eyelid: Secondary | ICD-10-CM | POA: Diagnosis not present

## 2018-10-30 DIAGNOSIS — G4733 Obstructive sleep apnea (adult) (pediatric): Secondary | ICD-10-CM | POA: Diagnosis not present

## 2018-10-30 DIAGNOSIS — Z9989 Dependence on other enabling machines and devices: Secondary | ICD-10-CM | POA: Diagnosis not present

## 2018-10-30 DIAGNOSIS — H15101 Unspecified episcleritis, right eye: Secondary | ICD-10-CM | POA: Diagnosis not present

## 2018-11-13 DIAGNOSIS — Z09 Encounter for follow-up examination after completed treatment for conditions other than malignant neoplasm: Secondary | ICD-10-CM | POA: Diagnosis not present

## 2019-03-17 DIAGNOSIS — G4733 Obstructive sleep apnea (adult) (pediatric): Secondary | ICD-10-CM | POA: Diagnosis not present

## 2019-03-17 DIAGNOSIS — Z9989 Dependence on other enabling machines and devices: Secondary | ICD-10-CM | POA: Diagnosis not present

## 2019-04-26 DIAGNOSIS — F4321 Adjustment disorder with depressed mood: Secondary | ICD-10-CM | POA: Diagnosis not present

## 2019-04-28 DIAGNOSIS — Z0000 Encounter for general adult medical examination without abnormal findings: Secondary | ICD-10-CM | POA: Diagnosis not present

## 2019-04-28 DIAGNOSIS — Z125 Encounter for screening for malignant neoplasm of prostate: Secondary | ICD-10-CM | POA: Diagnosis not present

## 2019-05-07 DIAGNOSIS — F4321 Adjustment disorder with depressed mood: Secondary | ICD-10-CM | POA: Diagnosis not present

## 2019-05-13 DIAGNOSIS — Z0000 Encounter for general adult medical examination without abnormal findings: Secondary | ICD-10-CM | POA: Diagnosis not present

## 2019-05-14 DIAGNOSIS — F4321 Adjustment disorder with depressed mood: Secondary | ICD-10-CM | POA: Diagnosis not present

## 2019-05-28 DIAGNOSIS — F4321 Adjustment disorder with depressed mood: Secondary | ICD-10-CM | POA: Diagnosis not present

## 2019-06-11 DIAGNOSIS — F4321 Adjustment disorder with depressed mood: Secondary | ICD-10-CM | POA: Diagnosis not present

## 2019-06-19 DIAGNOSIS — F4321 Adjustment disorder with depressed mood: Secondary | ICD-10-CM | POA: Diagnosis not present

## 2019-06-25 DIAGNOSIS — F4321 Adjustment disorder with depressed mood: Secondary | ICD-10-CM | POA: Diagnosis not present

## 2019-06-28 DIAGNOSIS — E785 Hyperlipidemia, unspecified: Secondary | ICD-10-CM | POA: Diagnosis not present

## 2019-06-28 DIAGNOSIS — M542 Cervicalgia: Secondary | ICD-10-CM | POA: Diagnosis not present

## 2019-06-28 DIAGNOSIS — F4312 Post-traumatic stress disorder, chronic: Secondary | ICD-10-CM | POA: Diagnosis not present

## 2019-06-28 DIAGNOSIS — G4733 Obstructive sleep apnea (adult) (pediatric): Secondary | ICD-10-CM | POA: Diagnosis not present

## 2019-07-02 DIAGNOSIS — F4321 Adjustment disorder with depressed mood: Secondary | ICD-10-CM | POA: Diagnosis not present

## 2019-07-09 DIAGNOSIS — F4321 Adjustment disorder with depressed mood: Secondary | ICD-10-CM | POA: Diagnosis not present

## 2019-07-15 DIAGNOSIS — F4321 Adjustment disorder with depressed mood: Secondary | ICD-10-CM | POA: Diagnosis not present

## 2019-07-23 DIAGNOSIS — F4321 Adjustment disorder with depressed mood: Secondary | ICD-10-CM | POA: Diagnosis not present

## 2019-07-30 DIAGNOSIS — F4321 Adjustment disorder with depressed mood: Secondary | ICD-10-CM | POA: Diagnosis not present

## 2019-08-05 DIAGNOSIS — Z7189 Other specified counseling: Secondary | ICD-10-CM | POA: Diagnosis not present

## 2019-08-05 DIAGNOSIS — H6122 Impacted cerumen, left ear: Secondary | ICD-10-CM | POA: Diagnosis not present

## 2019-08-05 DIAGNOSIS — R42 Dizziness and giddiness: Secondary | ICD-10-CM | POA: Diagnosis not present

## 2019-08-06 DIAGNOSIS — F4321 Adjustment disorder with depressed mood: Secondary | ICD-10-CM | POA: Diagnosis not present

## 2019-08-13 DIAGNOSIS — Z20828 Contact with and (suspected) exposure to other viral communicable diseases: Secondary | ICD-10-CM | POA: Diagnosis not present

## 2019-08-13 DIAGNOSIS — F4321 Adjustment disorder with depressed mood: Secondary | ICD-10-CM | POA: Diagnosis not present

## 2019-08-20 DIAGNOSIS — H903 Sensorineural hearing loss, bilateral: Secondary | ICD-10-CM | POA: Diagnosis not present

## 2019-08-20 DIAGNOSIS — R42 Dizziness and giddiness: Secondary | ICD-10-CM | POA: Diagnosis not present

## 2019-08-20 DIAGNOSIS — F4321 Adjustment disorder with depressed mood: Secondary | ICD-10-CM | POA: Diagnosis not present

## 2019-08-26 DIAGNOSIS — F4321 Adjustment disorder with depressed mood: Secondary | ICD-10-CM | POA: Diagnosis not present

## 2019-08-27 DIAGNOSIS — H903 Sensorineural hearing loss, bilateral: Secondary | ICD-10-CM | POA: Diagnosis not present

## 2019-08-27 DIAGNOSIS — R42 Dizziness and giddiness: Secondary | ICD-10-CM | POA: Diagnosis not present

## 2019-09-03 DIAGNOSIS — F4321 Adjustment disorder with depressed mood: Secondary | ICD-10-CM | POA: Diagnosis not present

## 2019-09-03 DIAGNOSIS — H832X2 Labyrinthine dysfunction, left ear: Secondary | ICD-10-CM | POA: Diagnosis not present

## 2019-09-03 DIAGNOSIS — R42 Dizziness and giddiness: Secondary | ICD-10-CM | POA: Diagnosis not present

## 2019-09-13 DIAGNOSIS — F4321 Adjustment disorder with depressed mood: Secondary | ICD-10-CM | POA: Diagnosis not present

## 2019-09-17 DIAGNOSIS — R42 Dizziness and giddiness: Secondary | ICD-10-CM | POA: Diagnosis not present

## 2019-09-17 DIAGNOSIS — H832X2 Labyrinthine dysfunction, left ear: Secondary | ICD-10-CM | POA: Diagnosis not present

## 2019-09-24 DIAGNOSIS — F4321 Adjustment disorder with depressed mood: Secondary | ICD-10-CM | POA: Diagnosis not present

## 2019-10-01 DIAGNOSIS — F4321 Adjustment disorder with depressed mood: Secondary | ICD-10-CM | POA: Diagnosis not present

## 2019-10-01 DIAGNOSIS — R42 Dizziness and giddiness: Secondary | ICD-10-CM | POA: Diagnosis not present

## 2019-10-01 DIAGNOSIS — H832X2 Labyrinthine dysfunction, left ear: Secondary | ICD-10-CM | POA: Diagnosis not present

## 2019-10-22 DIAGNOSIS — R42 Dizziness and giddiness: Secondary | ICD-10-CM | POA: Diagnosis not present

## 2019-10-22 DIAGNOSIS — H832X2 Labyrinthine dysfunction, left ear: Secondary | ICD-10-CM | POA: Diagnosis not present

## 2019-11-10 DIAGNOSIS — F4321 Adjustment disorder with depressed mood: Secondary | ICD-10-CM | POA: Diagnosis not present

## 2019-12-06 DIAGNOSIS — M9902 Segmental and somatic dysfunction of thoracic region: Secondary | ICD-10-CM | POA: Diagnosis not present

## 2019-12-06 DIAGNOSIS — M9903 Segmental and somatic dysfunction of lumbar region: Secondary | ICD-10-CM | POA: Diagnosis not present

## 2019-12-06 DIAGNOSIS — M4722 Other spondylosis with radiculopathy, cervical region: Secondary | ICD-10-CM | POA: Diagnosis not present

## 2019-12-06 DIAGNOSIS — M9901 Segmental and somatic dysfunction of cervical region: Secondary | ICD-10-CM | POA: Diagnosis not present

## 2019-12-21 DIAGNOSIS — M9902 Segmental and somatic dysfunction of thoracic region: Secondary | ICD-10-CM | POA: Diagnosis not present

## 2019-12-21 DIAGNOSIS — M4722 Other spondylosis with radiculopathy, cervical region: Secondary | ICD-10-CM | POA: Diagnosis not present

## 2019-12-21 DIAGNOSIS — M9903 Segmental and somatic dysfunction of lumbar region: Secondary | ICD-10-CM | POA: Diagnosis not present

## 2019-12-21 DIAGNOSIS — M9901 Segmental and somatic dysfunction of cervical region: Secondary | ICD-10-CM | POA: Diagnosis not present

## 2020-01-10 DIAGNOSIS — M9903 Segmental and somatic dysfunction of lumbar region: Secondary | ICD-10-CM | POA: Diagnosis not present

## 2020-01-10 DIAGNOSIS — M9902 Segmental and somatic dysfunction of thoracic region: Secondary | ICD-10-CM | POA: Diagnosis not present

## 2020-01-10 DIAGNOSIS — M4722 Other spondylosis with radiculopathy, cervical region: Secondary | ICD-10-CM | POA: Diagnosis not present

## 2020-01-10 DIAGNOSIS — M9901 Segmental and somatic dysfunction of cervical region: Secondary | ICD-10-CM | POA: Diagnosis not present

## 2020-01-24 DIAGNOSIS — M9902 Segmental and somatic dysfunction of thoracic region: Secondary | ICD-10-CM | POA: Diagnosis not present

## 2020-01-24 DIAGNOSIS — M9903 Segmental and somatic dysfunction of lumbar region: Secondary | ICD-10-CM | POA: Diagnosis not present

## 2020-01-24 DIAGNOSIS — M4722 Other spondylosis with radiculopathy, cervical region: Secondary | ICD-10-CM | POA: Diagnosis not present

## 2020-01-24 DIAGNOSIS — M9901 Segmental and somatic dysfunction of cervical region: Secondary | ICD-10-CM | POA: Diagnosis not present

## 2020-03-17 DIAGNOSIS — M79645 Pain in left finger(s): Secondary | ICD-10-CM | POA: Diagnosis not present

## 2020-03-17 DIAGNOSIS — M542 Cervicalgia: Secondary | ICD-10-CM | POA: Diagnosis not present

## 2020-04-05 DIAGNOSIS — K625 Hemorrhage of anus and rectum: Secondary | ICD-10-CM | POA: Diagnosis not present

## 2020-04-05 DIAGNOSIS — R195 Other fecal abnormalities: Secondary | ICD-10-CM | POA: Diagnosis not present

## 2020-05-10 DIAGNOSIS — Z0000 Encounter for general adult medical examination without abnormal findings: Secondary | ICD-10-CM | POA: Diagnosis not present

## 2020-05-10 DIAGNOSIS — R52 Pain, unspecified: Secondary | ICD-10-CM | POA: Diagnosis not present

## 2020-05-10 DIAGNOSIS — Z125 Encounter for screening for malignant neoplasm of prostate: Secondary | ICD-10-CM | POA: Diagnosis not present

## 2020-05-10 DIAGNOSIS — Z13220 Encounter for screening for lipoid disorders: Secondary | ICD-10-CM | POA: Diagnosis not present

## 2020-05-15 DIAGNOSIS — Z0000 Encounter for general adult medical examination without abnormal findings: Secondary | ICD-10-CM | POA: Diagnosis not present

## 2020-06-14 DIAGNOSIS — G4733 Obstructive sleep apnea (adult) (pediatric): Secondary | ICD-10-CM | POA: Diagnosis not present

## 2020-07-24 ENCOUNTER — Ambulatory Visit: Payer: Federal, State, Local not specified - PPO | Admitting: Family Medicine

## 2020-07-24 ENCOUNTER — Other Ambulatory Visit: Payer: Self-pay

## 2020-07-24 ENCOUNTER — Ambulatory Visit: Payer: Self-pay

## 2020-07-24 ENCOUNTER — Encounter: Payer: Self-pay | Admitting: Family Medicine

## 2020-07-24 VITALS — BP 120/80 | HR 64 | Ht 69.0 in | Wt 198.0 lb

## 2020-07-24 DIAGNOSIS — M25511 Pain in right shoulder: Secondary | ICD-10-CM

## 2020-07-24 DIAGNOSIS — M25512 Pain in left shoulder: Secondary | ICD-10-CM

## 2020-07-24 NOTE — Progress Notes (Signed)
    Subjective:   I, Peter Riley, am serving as a scribe for Dr. [name].  CC: Bilateral Shoulder pain L>R  HPI: Patient is a 54 year old Male presenting to [facility] for bilateral shoulder pain. X2 months and locates pain to top of shoulder. Describes pain as an aching pain, did states he was doing some pushups when he noticed the pain.  He works at the [employer] in [employer].  He does have to lift occasional objects at work.  He does have some pain at work.  Neck pain: no Radiating: no  Numbness/tingling: has hand numbness not sure if related  Aggravating symptoms: lifting arm worse in morning Mechanical symptoms: no  Tried: nothing   Pertinent review of Systems: No fevers or chills  Relevant historical information: Allergies.   Objective:    Vitals:   07/24/20 1053  BP: 120/80  Pulse: 64  SpO2: 97%   General: Well Developed, well nourished, and in no acute distress.   MSK: Arms and shoulders bilaterally normal-appearing with well-developed musculature. Right shoulder nontender normal motion some pain with abduction Strength intact abduction external/internal rotation. Mildly positive Hawkins and Neer's test mildly positive empty can test. Negative Yergason's and speeds test. Mildly positive crossover arm compression test.  Left shoulder normal motion nontender. Normal strength abduction external/internal rotation pain with abduction. Positive Hawkins and Neer's test positive empty can test. Negative Yergason's and speeds test. Positive crossarm compression test.  Pulses capillary refill and sensation intact distally bilateral upper extremities  Lab and Radiology Results  Diagnostic Limited MSK Ultrasound of: Left shoulder Biceps tendon intact normal-appearing Subscapularis tendon intact normal-appearing Supraspinatus tendon intact normal-appearing small amount of subacromial bursitis present. Infraspinatus tendon intact  normal-appearing AC joint degenerative with mild effusion. Impression: Acromial bursitis and AC DJD    Impression and Recommendations:    Assessment and Plan: 54 y.o. 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  Patient does a fair amount of weight lifting and may have more of an AC joint issue then treat bursitis.  Discussed options with patient.  Plan for home exercise program as taught in clinic today by me.  If not improving neck step would be either injection or physical therapy.  Patient will notify me.  Also recommend Voltaren gel.  Recheck back as needed.Marland Kitchen  PDMP not reviewed this encounter. Orders Placed This Encounter  Procedures  . [place] LIMITED JOINT SPACE STRUCTURES UP LEFT(NO LINKED CHARGES)    Standing Status:   Future    Number of Occurrences:   1    Standing Expiration Date:   07/24/2021    Order Specific Question:   Reason for Exam (SYMPTOM  OR DIAGNOSIS REQUIRED)    Answer:   Bilateral shoulder pain    Order Specific Question:   Preferred imaging location?    Answer:   [place]   No orders of the defined types were placed in this encounter.   Discussed warning signs or symptoms. Please see discharge instructions. Patient expresses understanding.   The above documentation has been reviewed and is accurate and complete [name], M.D.

## 2020-07-24 NOTE — Patient Instructions (Signed)
Thank you for coming in today.  I think you have rotator cuff tendonitis.  Do the home exercise.   Front to the level of the shoulder Side to the level of the shoulder Internal rotation External rotation.  30 reps 2-3x daily  Please use voltaren gel up to 4x daily for pain as needed.    If not improving in 2-3 weeks let me know. I will order formal PT.  Return at any time for injection if bad enough.    Shoulder Impingement Syndrome Rehab Ask your health care provider which exercises are safe for you. Do exercises exactly as told by your health care provider and adjust them as directed. It is normal to feel mild stretching, pulling, tightness, or discomfort as you do these exercises. Stop right away if you feel sudden pain or your pain gets worse. Do not begin these exercises until told by your health care provider. Stretching and range-of-motion exercise This exercise warms up your muscles and joints and improves the movement and flexibility of your shoulder. This exercise also helps to relieve pain and stiffness. Passive horizontal adduction In passive adduction, you use your other hand to move the injured arm toward your body. The injured arm does not move on its own. In this movement, your arm is moved across your body in the horizontal plane (horizontal adduction). 1. Sit or stand and pull your left / right elbow across your chest, toward your other shoulder. Stop when you feel a gentle stretch in the back of your shoulder and upper arm. ? Keep your arm at shoulder height. ? Keep your arm as close to your body as you comfortably can. 2. Hold for __________ seconds. 3. Slowly return to the starting position. Repeat __________ times. Complete this exercise __________ times a day. Strengthening exercises These exercises build strength and endurance in your shoulder. Endurance is the ability to use your muscles for a long time, even after they get tired. External rotation,  isometric This is an exercise in which you press the back of your wrist against a door frame without moving your shoulder joint (isometric). 1. Stand or sit in a doorway, facing the door frame. 2. Bend your left / right elbow and place the back of your wrist against the door frame. Only the back of your wrist should be touching the frame. Keep your upper arm at your side. 3. Gently press your wrist against the door frame, as if you are trying to push your arm away from your abdomen (external rotation). Press as hard as you are able without pain. ? Avoid shrugging your shoulder while you press your wrist against the door frame. Keep your shoulder blade tucked down toward the middle of your back. 4. Hold for __________ seconds. 5. Slowly release the tension, and relax your muscles completely before you repeat the exercise. Repeat __________ times. Complete this exercise __________ times a day. Internal rotation, isometric This is an exercise in which you press your palm against a door frame without moving your shoulder joint (isometric). 1. Stand or sit in a doorway, facing the door frame. 2. Bend your left / right elbow and place the palm of your hand against the door frame. Only your palm should be touching the frame. Keep your upper arm at your side. 3. Gently press your hand against the door frame, as if you are trying to push your arm toward your abdomen (internal rotation). Press as hard as you are able without pain. ? Avoid  shrugging your shoulder while you press your hand against the door frame. Keep your shoulder blade tucked down toward the middle of your back. 4. Hold for __________ seconds. 5. Slowly release the tension, and relax your muscles completely before you repeat the exercise. Repeat __________ times. Complete this exercise __________ times a day. Scapular protraction, supine  1. Lie on your back on a firm surface (supine position). Hold a __________ weight in your left / right  hand. 2. Raise your left / right arm straight into the air so your hand is directly above your shoulder joint. 3. Push the weight into the air so your shoulder (scapula) lifts off the surface that you are lying on. The scapula will push up or forward (protraction). Do not move your head, neck, or back. 4. Hold for __________ seconds. 5. Slowly return to the starting position. Let your muscles relax completely before you repeat this exercise. Repeat __________ times. Complete this exercise __________ times a day. Scapular retraction  1. Sit in a stable chair without armrests, or stand up. 2. Secure an exercise band to a stable object in front of you so the band is at shoulder height. 3. Hold one end of the exercise band in each hand. Your palms should face down. 4. Squeeze your shoulder blades together (retraction) and move your elbows slightly behind you. Do not shrug your shoulders upward while you do this. 5. Hold for __________ seconds. 6. Slowly return to the starting position. Repeat __________ times. Complete this exercise __________ times a day. Shoulder extension  1. Sit in a stable chair without armrests, or stand up. 2. Secure an exercise band to a stable object in front of you so the band is above shoulder height. 3. Hold one end of the exercise band in each hand. 4. Straighten your elbows and lift your hands up to shoulder height. 5. Squeeze your shoulder blades together and pull your hands down to the sides of your thighs (extension). Stop when your hands are straight down by your sides. Do not let your hands go behind your body. 6. Hold for __________ seconds. 7. Slowly return to the starting position. Repeat __________ times. Complete this exercise __________ times a day. This information is not intended to replace advice given to you by your health care provider. Make sure you discuss any questions you have with your health care provider. Document Revised: 01/22/2019  Document Reviewed: 10/26/2018 Elsevier Patient Education  [place].

## 2020-11-10 DIAGNOSIS — H2513 Age-related nuclear cataract, bilateral: Secondary | ICD-10-CM | POA: Diagnosis not present

## 2020-11-10 DIAGNOSIS — H47393 Other disorders of optic disc, bilateral: Secondary | ICD-10-CM | POA: Diagnosis not present

## 2020-11-10 DIAGNOSIS — H524 Presbyopia: Secondary | ICD-10-CM | POA: Diagnosis not present

## 2020-11-14 DIAGNOSIS — Z20822 Contact with and (suspected) exposure to covid-19: Secondary | ICD-10-CM | POA: Diagnosis not present

## 2020-11-16 DIAGNOSIS — L820 Inflamed seborrheic keratosis: Secondary | ICD-10-CM | POA: Diagnosis not present

## 2020-11-16 DIAGNOSIS — L918 Other hypertrophic disorders of the skin: Secondary | ICD-10-CM | POA: Diagnosis not present

## 2020-11-16 DIAGNOSIS — L814 Other melanin hyperpigmentation: Secondary | ICD-10-CM | POA: Diagnosis not present

## 2020-11-16 DIAGNOSIS — L578 Other skin changes due to chronic exposure to nonionizing radiation: Secondary | ICD-10-CM | POA: Diagnosis not present

## 2021-01-19 DIAGNOSIS — M25552 Pain in left hip: Secondary | ICD-10-CM | POA: Diagnosis not present

## 2021-04-03 ENCOUNTER — Ambulatory Visit: Payer: Federal, State, Local not specified - PPO | Admitting: Physician Assistant

## 2021-04-12 DIAGNOSIS — M79602 Pain in left arm: Secondary | ICD-10-CM | POA: Diagnosis not present

## 2021-05-25 DIAGNOSIS — Z0000 Encounter for general adult medical examination without abnormal findings: Secondary | ICD-10-CM | POA: Diagnosis not present

## 2021-05-25 DIAGNOSIS — R7303 Prediabetes: Secondary | ICD-10-CM | POA: Diagnosis not present

## 2021-05-25 DIAGNOSIS — Z125 Encounter for screening for malignant neoplasm of prostate: Secondary | ICD-10-CM | POA: Diagnosis not present

## 2021-06-07 DIAGNOSIS — G5601 Carpal tunnel syndrome, right upper limb: Secondary | ICD-10-CM | POA: Diagnosis not present

## 2021-06-07 DIAGNOSIS — G5621 Lesion of ulnar nerve, right upper limb: Secondary | ICD-10-CM | POA: Diagnosis not present

## 2021-06-07 DIAGNOSIS — M180 Bilateral primary osteoarthritis of first carpometacarpal joints: Secondary | ICD-10-CM | POA: Diagnosis not present

## 2021-07-12 DIAGNOSIS — G5601 Carpal tunnel syndrome, right upper limb: Secondary | ICD-10-CM | POA: Diagnosis not present

## 2021-09-21 DIAGNOSIS — M9904 Segmental and somatic dysfunction of sacral region: Secondary | ICD-10-CM | POA: Diagnosis not present

## 2021-09-21 DIAGNOSIS — M9903 Segmental and somatic dysfunction of lumbar region: Secondary | ICD-10-CM | POA: Diagnosis not present

## 2021-09-21 DIAGNOSIS — M5451 Vertebrogenic low back pain: Secondary | ICD-10-CM | POA: Diagnosis not present

## 2021-09-21 DIAGNOSIS — M461 Sacroiliitis, not elsewhere classified: Secondary | ICD-10-CM | POA: Diagnosis not present

## 2021-10-11 DIAGNOSIS — M542 Cervicalgia: Secondary | ICD-10-CM | POA: Diagnosis not present

## 2021-11-16 DIAGNOSIS — H524 Presbyopia: Secondary | ICD-10-CM | POA: Diagnosis not present

## 2021-11-16 DIAGNOSIS — H2513 Age-related nuclear cataract, bilateral: Secondary | ICD-10-CM | POA: Diagnosis not present

## 2021-11-16 DIAGNOSIS — H40003 Preglaucoma, unspecified, bilateral: Secondary | ICD-10-CM | POA: Diagnosis not present

## 2021-11-20 DIAGNOSIS — G4733 Obstructive sleep apnea (adult) (pediatric): Secondary | ICD-10-CM | POA: Diagnosis not present

## 2021-12-05 DIAGNOSIS — H53149 Visual discomfort, unspecified: Secondary | ICD-10-CM | POA: Diagnosis not present

## 2021-12-05 DIAGNOSIS — H524 Presbyopia: Secondary | ICD-10-CM | POA: Diagnosis not present

## 2022-02-25 DIAGNOSIS — R1031 Right lower quadrant pain: Secondary | ICD-10-CM | POA: Diagnosis not present

## 2022-02-25 DIAGNOSIS — R1032 Left lower quadrant pain: Secondary | ICD-10-CM | POA: Diagnosis not present

## 2022-02-25 DIAGNOSIS — K625 Hemorrhage of anus and rectum: Secondary | ICD-10-CM | POA: Diagnosis not present

## 2022-05-17 ENCOUNTER — Ambulatory Visit: Payer: Federal, State, Local not specified - PPO | Admitting: Podiatry

## 2022-05-17 ENCOUNTER — Encounter: Payer: Self-pay | Admitting: Podiatry

## 2022-05-17 DIAGNOSIS — H2513 Age-related nuclear cataract, bilateral: Secondary | ICD-10-CM | POA: Diagnosis not present

## 2022-05-17 DIAGNOSIS — M7661 Achilles tendinitis, right leg: Secondary | ICD-10-CM

## 2022-05-17 DIAGNOSIS — H40003 Preglaucoma, unspecified, bilateral: Secondary | ICD-10-CM | POA: Diagnosis not present

## 2022-05-17 DIAGNOSIS — M7662 Achilles tendinitis, left leg: Secondary | ICD-10-CM

## 2022-05-17 DIAGNOSIS — M79671 Pain in right foot: Secondary | ICD-10-CM

## 2022-05-17 DIAGNOSIS — H524 Presbyopia: Secondary | ICD-10-CM | POA: Diagnosis not present

## 2022-05-17 MED ORDER — MELOXICAM 15 MG PO TABS: 15.0000 mg | ORAL_TABLET | Freq: Every day | ORAL | 0 refills | Status: DC

## 2022-05-17 NOTE — Progress Notes (Signed)
  Subjective:  Patient ID: Peter Riley, male    DOB: 1966/03/12,   MRN: [id number]  Chief Complaint  Patient presents with   Foot Pain    Bil heel pain - ongoing for 1 month    56 y.o. male presents for concern of right heel pain that has been going on for about a month. Relates the right is the primary concern but does have some pain in the left. Relates constant pain that mostly occurs in the morning. Relates pain when walking. Rest helps but minimal relief with OTC medications.   Denies any other pedal complaints. Denies n/v/f/c.   Past Medical History:  Diagnosis Date   Allergic rhinitis, seasonal     Objective:  Physical Exam: Vascular: DP/PT pulses 2/4 bilateral. CFT <3 seconds. Normal hair growth on digits. No edema.  Skin. No lacerations or abrasions bilateral feet.  Musculoskeletal: MMT 5/5 bilateral lower extremities in DF, PF, Inversion and Eversion. Deceased ROM in DF of ankle joint. Tender to insertion of achilles tendon bilateral more so on the right. Some pain with PF and DF as well as eversion and inversion on the right. No pain proximally along the tendon bilateral tendon intact with no palpable defect.  Neurological: Sensation intact to light touch.   Assessment:   1. Achilles tendonitis, bilateral      Plan:  Patient was evaluated and treated and all questions answered. -Patient refused X-rays today.  -Discussed Achilles insertional tendonitis and treatment options with patient.  -Discussed stretching exercises. -Rx Meloxicam provided  -Heel lifts provided and discussed proper shoewear.  -Discussed if no improvement will consider MRI/PT/EPAT/PRP injections.  -Patient to return in 6 weeks for re-evaluation and possible X-rays.     Lorenda Peck, DPM

## 2022-05-17 NOTE — Patient Instructions (Signed)

## 2022-05-24 DIAGNOSIS — Z0000 Encounter for general adult medical examination without abnormal findings: Secondary | ICD-10-CM | POA: Diagnosis not present

## 2022-05-28 ENCOUNTER — Other Ambulatory Visit: Payer: Self-pay

## 2022-05-28 DIAGNOSIS — Z0000 Encounter for general adult medical examination without abnormal findings: Secondary | ICD-10-CM | POA: Diagnosis not present

## 2022-05-28 DIAGNOSIS — R7303 Prediabetes: Secondary | ICD-10-CM | POA: Diagnosis not present

## 2022-05-28 DIAGNOSIS — G4730 Sleep apnea, unspecified: Secondary | ICD-10-CM | POA: Diagnosis not present

## 2022-05-28 DIAGNOSIS — E785 Hyperlipidemia, unspecified: Secondary | ICD-10-CM

## 2022-06-06 DIAGNOSIS — M2142 Flat foot [pes planus] (acquired), left foot: Secondary | ICD-10-CM | POA: Diagnosis not present

## 2022-06-06 DIAGNOSIS — M2141 Flat foot [pes planus] (acquired), right foot: Secondary | ICD-10-CM | POA: Diagnosis not present

## 2022-06-06 DIAGNOSIS — M47817 Spondylosis without myelopathy or radiculopathy, lumbosacral region: Secondary | ICD-10-CM | POA: Diagnosis not present

## 2022-06-06 DIAGNOSIS — M19071 Primary osteoarthritis, right ankle and foot: Secondary | ICD-10-CM | POA: Diagnosis not present

## 2022-06-10 ENCOUNTER — Ambulatory Visit
Admission: RE | Admit: 2022-06-10 | Discharge: 2022-06-10 | Disposition: A | Discharge status: Home / Self Care | Payer: Federal, State, Local not specified - PPO | Source: Ambulatory Visit

## 2022-06-10 DIAGNOSIS — I712 Thoracic aortic aneurysm, without rupture, unspecified: Secondary | ICD-10-CM | POA: Diagnosis not present

## 2022-06-10 DIAGNOSIS — E785 Hyperlipidemia, unspecified: Secondary | ICD-10-CM

## 2022-06-24 ENCOUNTER — Other Ambulatory Visit: Payer: Self-pay

## 2022-06-24 DIAGNOSIS — R9389 Abnormal findings on diagnostic imaging of other specified body structures: Secondary | ICD-10-CM

## 2022-06-28 ENCOUNTER — Ambulatory Visit: Payer: Federal, State, Local not specified - PPO | Admitting: Podiatry

## 2022-06-28 ENCOUNTER — Ambulatory Visit
Admission: RE | Admit: 2022-06-28 | Discharge: 2022-06-28 | Disposition: A | Discharge status: Home / Self Care | Payer: Federal, State, Local not specified - PPO | Source: Ambulatory Visit

## 2022-06-28 DIAGNOSIS — K7689 Other specified diseases of liver: Secondary | ICD-10-CM | POA: Diagnosis not present

## 2022-06-28 DIAGNOSIS — N2889 Other specified disorders of kidney and ureter: Secondary | ICD-10-CM | POA: Diagnosis not present

## 2022-06-28 DIAGNOSIS — R9389 Abnormal findings on diagnostic imaging of other specified body structures: Secondary | ICD-10-CM

## 2022-08-09 DIAGNOSIS — M7660 Achilles tendinitis, unspecified leg: Secondary | ICD-10-CM | POA: Diagnosis not present

## 2022-08-09 DIAGNOSIS — M2140 Flat foot [pes planus] (acquired), unspecified foot: Secondary | ICD-10-CM | POA: Diagnosis not present

## 2022-08-21 DIAGNOSIS — M545 Low back pain, unspecified: Secondary | ICD-10-CM | POA: Diagnosis not present

## 2022-08-21 DIAGNOSIS — R42 Dizziness and giddiness: Secondary | ICD-10-CM | POA: Diagnosis not present

## 2022-09-20 DIAGNOSIS — G4733 Obstructive sleep apnea (adult) (pediatric): Secondary | ICD-10-CM | POA: Diagnosis not present

## 2022-09-20 DIAGNOSIS — Z72821 Inadequate sleep hygiene: Secondary | ICD-10-CM | POA: Diagnosis not present

## 2022-10-02 DIAGNOSIS — F4312 Post-traumatic stress disorder, chronic: Secondary | ICD-10-CM | POA: Diagnosis not present

## 2022-10-04 DIAGNOSIS — M5451 Vertebrogenic low back pain: Secondary | ICD-10-CM | POA: Diagnosis not present

## 2022-10-04 DIAGNOSIS — M9904 Segmental and somatic dysfunction of sacral region: Secondary | ICD-10-CM | POA: Diagnosis not present

## 2022-10-04 DIAGNOSIS — M9903 Segmental and somatic dysfunction of lumbar region: Secondary | ICD-10-CM | POA: Diagnosis not present

## 2022-10-04 DIAGNOSIS — M461 Sacroiliitis, not elsewhere classified: Secondary | ICD-10-CM | POA: Diagnosis not present

## 2022-10-22 DIAGNOSIS — M542 Cervicalgia: Secondary | ICD-10-CM | POA: Diagnosis not present

## 2022-11-08 DIAGNOSIS — M542 Cervicalgia: Secondary | ICD-10-CM | POA: Diagnosis not present

## 2022-11-25 DIAGNOSIS — M546 Pain in thoracic spine: Secondary | ICD-10-CM | POA: Diagnosis not present

## 2022-11-25 DIAGNOSIS — M9902 Segmental and somatic dysfunction of thoracic region: Secondary | ICD-10-CM | POA: Diagnosis not present

## 2022-11-25 DIAGNOSIS — M9901 Segmental and somatic dysfunction of cervical region: Secondary | ICD-10-CM | POA: Diagnosis not present

## 2022-11-25 DIAGNOSIS — M5412 Radiculopathy, cervical region: Secondary | ICD-10-CM | POA: Diagnosis not present

## 2022-12-12 DIAGNOSIS — M7062 Trochanteric bursitis, left hip: Secondary | ICD-10-CM | POA: Diagnosis not present

## 2023-01-24 DIAGNOSIS — H2513 Age-related nuclear cataract, bilateral: Secondary | ICD-10-CM | POA: Diagnosis not present

## 2023-01-24 DIAGNOSIS — H40013 Open angle with borderline findings, low risk, bilateral: Secondary | ICD-10-CM | POA: Diagnosis not present

## 2023-01-24 DIAGNOSIS — H524 Presbyopia: Secondary | ICD-10-CM | POA: Diagnosis not present

## 2023-03-24 DIAGNOSIS — R1031 Right lower quadrant pain: Secondary | ICD-10-CM | POA: Diagnosis not present

## 2023-04-01 DIAGNOSIS — M25559 Pain in unspecified hip: Secondary | ICD-10-CM | POA: Diagnosis not present

## 2023-04-01 DIAGNOSIS — M79669 Pain in unspecified lower leg: Secondary | ICD-10-CM | POA: Diagnosis not present

## 2023-04-11 DIAGNOSIS — M25559 Pain in unspecified hip: Secondary | ICD-10-CM | POA: Diagnosis not present

## 2023-04-11 DIAGNOSIS — M79669 Pain in unspecified lower leg: Secondary | ICD-10-CM | POA: Diagnosis not present

## 2023-04-25 DIAGNOSIS — M25559 Pain in unspecified hip: Secondary | ICD-10-CM | POA: Diagnosis not present

## 2023-04-25 DIAGNOSIS — M79669 Pain in unspecified lower leg: Secondary | ICD-10-CM | POA: Diagnosis not present

## 2023-04-29 DIAGNOSIS — M25559 Pain in unspecified hip: Secondary | ICD-10-CM | POA: Diagnosis not present

## 2023-04-29 DIAGNOSIS — M79669 Pain in unspecified lower leg: Secondary | ICD-10-CM | POA: Diagnosis not present

## 2023-05-01 DIAGNOSIS — G4733 Obstructive sleep apnea (adult) (pediatric): Secondary | ICD-10-CM | POA: Diagnosis not present

## 2023-05-09 DIAGNOSIS — G4733 Obstructive sleep apnea (adult) (pediatric): Secondary | ICD-10-CM | POA: Diagnosis not present

## 2023-05-26 DIAGNOSIS — Z125 Encounter for screening for malignant neoplasm of prostate: Secondary | ICD-10-CM | POA: Diagnosis not present

## 2023-05-26 DIAGNOSIS — R7303 Prediabetes: Secondary | ICD-10-CM | POA: Diagnosis not present

## 2023-05-26 DIAGNOSIS — R5382 Chronic fatigue, unspecified: Secondary | ICD-10-CM | POA: Diagnosis not present

## 2023-05-26 DIAGNOSIS — E785 Hyperlipidemia, unspecified: Secondary | ICD-10-CM | POA: Diagnosis not present

## 2023-05-26 DIAGNOSIS — Z0000 Encounter for general adult medical examination without abnormal findings: Secondary | ICD-10-CM | POA: Diagnosis not present

## 2023-05-27 DIAGNOSIS — M9901 Segmental and somatic dysfunction of cervical region: Secondary | ICD-10-CM | POA: Diagnosis not present

## 2023-05-27 DIAGNOSIS — M9902 Segmental and somatic dysfunction of thoracic region: Secondary | ICD-10-CM | POA: Diagnosis not present

## 2023-05-27 DIAGNOSIS — M546 Pain in thoracic spine: Secondary | ICD-10-CM | POA: Diagnosis not present

## 2023-05-27 DIAGNOSIS — M5412 Radiculopathy, cervical region: Secondary | ICD-10-CM | POA: Diagnosis not present

## 2023-06-02 DIAGNOSIS — E785 Hyperlipidemia, unspecified: Secondary | ICD-10-CM | POA: Diagnosis not present

## 2023-06-02 DIAGNOSIS — N1831 Chronic kidney disease, stage 3a: Secondary | ICD-10-CM | POA: Diagnosis not present

## 2023-06-02 DIAGNOSIS — Z0000 Encounter for general adult medical examination without abnormal findings: Secondary | ICD-10-CM | POA: Diagnosis not present

## 2023-06-02 DIAGNOSIS — R7303 Prediabetes: Secondary | ICD-10-CM | POA: Diagnosis not present

## 2023-07-07 DIAGNOSIS — R42 Dizziness and giddiness: Secondary | ICD-10-CM | POA: Diagnosis not present

## 2023-07-07 DIAGNOSIS — N529 Male erectile dysfunction, unspecified: Secondary | ICD-10-CM | POA: Diagnosis not present

## 2023-07-07 DIAGNOSIS — R001 Bradycardia, unspecified: Secondary | ICD-10-CM | POA: Diagnosis not present

## 2023-07-07 DIAGNOSIS — K219 Gastro-esophageal reflux disease without esophagitis: Secondary | ICD-10-CM | POA: Diagnosis not present

## 2023-07-21 DIAGNOSIS — R194 Change in bowel habit: Secondary | ICD-10-CM | POA: Diagnosis not present

## 2023-07-21 DIAGNOSIS — K624 Stenosis of anus and rectum: Secondary | ICD-10-CM | POA: Diagnosis not present

## 2023-07-21 DIAGNOSIS — K625 Hemorrhage of anus and rectum: Secondary | ICD-10-CM | POA: Diagnosis not present

## 2023-07-21 DIAGNOSIS — R1310 Dysphagia, unspecified: Secondary | ICD-10-CM | POA: Diagnosis not present

## 2023-08-07 DIAGNOSIS — K219 Gastro-esophageal reflux disease without esophagitis: Secondary | ICD-10-CM | POA: Diagnosis not present

## 2023-08-07 DIAGNOSIS — R1110 Vomiting, unspecified: Secondary | ICD-10-CM | POA: Diagnosis not present

## 2023-08-07 DIAGNOSIS — R1310 Dysphagia, unspecified: Secondary | ICD-10-CM | POA: Diagnosis not present

## 2023-08-19 DIAGNOSIS — K625 Hemorrhage of anus and rectum: Secondary | ICD-10-CM | POA: Diagnosis not present

## 2023-08-19 DIAGNOSIS — K222 Esophageal obstruction: Secondary | ICD-10-CM | POA: Diagnosis not present

## 2023-08-19 DIAGNOSIS — K921 Melena: Secondary | ICD-10-CM | POA: Diagnosis not present

## 2023-08-19 DIAGNOSIS — K449 Diaphragmatic hernia without obstruction or gangrene: Secondary | ICD-10-CM | POA: Diagnosis not present

## 2023-08-19 DIAGNOSIS — R1310 Dysphagia, unspecified: Secondary | ICD-10-CM | POA: Diagnosis not present

## 2023-09-17 DIAGNOSIS — K222 Esophageal obstruction: Secondary | ICD-10-CM | POA: Diagnosis not present

## 2023-10-27 ENCOUNTER — Ambulatory Visit
Admission: RE | Admit: 2023-10-27 | Discharge: 2023-10-27 | Disposition: A | Discharge status: Home / Self Care | Payer: Commercial Managed Care - PPO | Source: Ambulatory Visit | Attending: Family Medicine | Admitting: Family Medicine

## 2023-10-27 VITALS — BP 125/73 | HR 75 | Temp 98.8°F | Resp 18 | Ht 69.0 in | Wt 200.0 lb

## 2023-10-27 DIAGNOSIS — B9689 Other specified bacterial agents as the cause of diseases classified elsewhere: Secondary | ICD-10-CM

## 2023-10-27 MED ORDER — PREDNISONE 50 MG PO TABS: ORAL_TABLET | ORAL | 0 refills | Status: AC

## 2023-10-27 MED ORDER — AMOXICILLIN-POT CLAVULANATE 875-125 MG PO TABS: 1.0000 | ORAL_TABLET | Freq: Two times a day (BID) | ORAL | 0 refills | Status: AC

## 2023-10-27 MED ORDER — FLUTICASONE PROPIONATE 50 MCG/ACT NA SUSP: 2.0000 | Freq: Every day | NASAL | 0 refills | Status: AC

## 2023-10-27 NOTE — ED Triage Notes (Signed)
 Patient c/o cough, headache, nasal drainage, hoarseness x 5 days.  Patient has taken Nyquil, Saline rinses, OTC mucous medication.

## 2023-10-27 NOTE — Discharge Instructions (Signed)
 Drink lots of water Take the prednisone once a day for 5 days Take the Augmentin antibiotic 2 times a day for a week May use Flonase for the sinus congestion See your doctor if not better by next week

## 2023-12-18 ENCOUNTER — Ambulatory Visit
Admission: RE | Admit: 2023-12-18 | Discharge: 2023-12-18 | Disposition: A | Discharge status: Home / Self Care | Source: Ambulatory Visit | Attending: Emergency Medicine | Admitting: Emergency Medicine

## 2023-12-18 VITALS — BP 106/66 | HR 69 | Temp 98.1°F | Resp 18 | Ht 69.0 in | Wt 200.0 lb

## 2023-12-18 DIAGNOSIS — B349 Viral infection, unspecified: Secondary | ICD-10-CM | POA: Diagnosis not present

## 2023-12-18 LAB — POCT INFLUENZA A/B
Influenza A, POC: NEGATIVE
Influenza B, POC: NEGATIVE

## 2023-12-18 NOTE — ED Provider Notes (Signed)
 [facility]    CSN: [id number] Arrival date & time: 12/18/23  1140      History   Chief Complaint Chief Complaint  Patient presents with   Fever    I had nausea yesterday - Entered by patient    HPI Peter Riley is a 58 y.o. male.   Patient presents to clinic over concerns of not feeling well, fever, nausea and vomiting.  Symptoms started yesterday and he had 1 episode of emesis last night.  Temperature was 103.  He took Advil for this.  Has had a headache, congestion, mild sore throat and a cough today.  Denies nausea, vomiting or diarrhea today.  The history is provided by the patient and medical records.  Fever   Past Medical History:  Diagnosis Date   Allergic rhinitis, seasonal     Patient Active Problem List   Diagnosis Date Noted   Left fourth proximal interphalangeal joint pain 06/10/2012    Past Surgical History:  Procedure Laterality Date   Vocal polyp removal     x6       Home Medications    Prior to Admission medications   Medication Sig Start Date End Date Taking? Authorizing Provider  amoxicillin-clavulanate (AUGMENTIN) 875-125 MG tablet Take 1 tablet by mouth every 12 (twelve) hours. 10/27/23   Eustace Moore, MD  fluticasone [facility]) 50 MCG/ACT nasal spray Place 2 sprays into both nostrils daily. 10/27/23   Eustace Moore, MD  predniSONE (DELTASONE) 50 MG tablet Take once a day for 5 days.  Take with food 10/27/23   Eustace Moore, MD    Family History Family History  Problem Relation Age of Onset   Healthy Mother     Social History Social History   Tobacco Use   Smoking status: Never   Smokeless tobacco: Never  Vaping Use   Vaping status: Never Used  Substance Use Topics   Alcohol use: No   Drug use: No     Allergies   Codeine and Other   Review of Systems Review of Systems  Per HPI   Physical Exam Triage Vital Signs ED Triage Vitals  Encounter Vitals Group     BP 12/18/23 1223 106/66      Systolic BP Percentile --      Diastolic BP Percentile --      Pulse Rate 12/18/23 1223 69     Resp 12/18/23 1223 18     Temp 12/18/23 1223 98.1 F (36.7 C)     Temp Source 12/18/23 1223 Oral     SpO2 12/18/23 1223 98 %     Weight 12/18/23 1225 200 lb (90.7 kg)     Height 12/18/23 1225 5\' 9"  (1.753 m)     Head Circumference --      Peak Flow --      Pain Score 12/18/23 1224 0     Pain Loc --      Pain Education --      Exclude from Growth Chart --    No data found.  Updated Vital Signs BP 106/66 (BP Location: Right Arm)   Pulse 69   Temp 98.1 F (36.7 C) (Oral)   Resp 18   Ht 5\' 9"  (1.753 m)   Wt 200 lb (90.7 kg)   SpO2 98%   BMI 29.53 kg/m   Visual Acuity Right Eye Distance:   Left Eye Distance:   Bilateral Distance:    Right Eye Near:   Left Eye Near:  Bilateral Near:     Physical Exam Vitals and nursing note reviewed.  Constitutional:      Appearance: Normal appearance.  HENT:     Head: Normocephalic and atraumatic.     Right Ear: External ear normal.     Left Ear: External ear normal.     Nose: Congestion and rhinorrhea present.     Mouth/Throat:     Mouth: Mucous membranes are moist.     Pharynx: Posterior oropharyngeal erythema present.  Eyes:     Conjunctiva/sclera: Conjunctivae normal.  Cardiovascular:     Rate and Rhythm: Normal rate and regular rhythm.     Heart sounds: Normal heart sounds. No murmur heard. Pulmonary:     Effort: Pulmonary effort is normal. No respiratory distress.     Breath sounds: Normal breath sounds.  Musculoskeletal:        General: Normal range of motion.  Lymphadenopathy:     Cervical: Cervical adenopathy present.  Skin:    General: Skin is warm and dry.  Neurological:     General: No focal deficit present.     Mental Status: He is alert.  Psychiatric:        Mood and Affect: Mood normal.      UC Treatments / Results  Labs (all labs ordered are listed, but only abnormal results are displayed) Labs  Reviewed  POCT INFLUENZA A/B - Normal    EKG   Radiology No results found.  Procedures Procedures (including critical care time)  Medications Ordered in UC Medications - No data to display  Initial Impression / Assessment and Plan / UC Course  I have reviewed the triage vital signs and the nursing notes.  Pertinent labs & imaging results that were available during my care of the patient were reviewed by me and considered in my medical decision making (see chart for details).  Vitals and triage reviewed, patient is hemodynamically stable.  Lungs are vesicular, heart with regular rate and rhythm.  Congestion, rhinorrhea postnasal drip present on physical exam.  POC testing negative for influenza.  Suspect other viral URI.  Symptomatic management discussed.  Work note provided.  Plan of care, follow-up care return precautions given, no questions at this time.     Final Clinical Impressions(s) / UC Diagnoses   Final diagnoses:  Viral illness     Discharge Instructions      Your flu testing was negative, you most likely have a different viral illness.  You can alternate between 800 mg of ibuprofen and 500 mg of Tylenol every 4-6 hours for fever, body aches and chills.  I suggest following a bland diet to avoid further irritating her stomach.  This consists of soups, toast, bananas, rice and applesauce.  Avoid spicy or fried foods.  Symptoms should get better over the next 5 to 7 days.  Return to clinic or follow-up with your pediatrician for any new concerning symptoms, or if no improvement.     ED Prescriptions   None    PDMP not reviewed this encounter.   Wendelin Bradt, [place], [place] 12/18/23 1336

## 2023-12-18 NOTE — ED Triage Notes (Signed)
 Patient c/o fever and vomiting yesterday.  Patient has taken Advil.  Did not take temp.

## 2023-12-18 NOTE — Discharge Instructions (Addendum)
 Your flu testing was negative, you most likely have a different viral illness.  You can alternate between 800 mg of ibuprofen and 500 mg of Tylenol every 4-6 hours for fever, body aches and chills.  I suggest following a bland diet to avoid further irritating her stomach.  This consists of soups, toast, bananas, rice and applesauce.  Avoid spicy or fried foods.  Symptoms should get better over the next 5 to 7 days.  Return to clinic or follow-up with your pediatrician for any new concerning symptoms, or if no improvement.
